# Patient Record
Sex: Male | Born: 1940 | Race: White | Hispanic: No | Marital: Married | Smoking: Former smoker
Health system: Southern US, Community
[De-identification: ages and names within clinical notes are randomized; demographics above are authoritative.]

## PROBLEM LIST (undated history)

## (undated) DIAGNOSIS — I493 Ventricular premature depolarization: Secondary | ICD-10-CM

## (undated) DIAGNOSIS — D696 Thrombocytopenia, unspecified: Secondary | ICD-10-CM

## (undated) DIAGNOSIS — S42009A Fracture of unspecified part of unspecified clavicle, initial encounter for closed fracture: Secondary | ICD-10-CM

## (undated) DIAGNOSIS — K219 Gastro-esophageal reflux disease without esophagitis: Secondary | ICD-10-CM

## (undated) DIAGNOSIS — I10 Essential (primary) hypertension: Secondary | ICD-10-CM

## (undated) DIAGNOSIS — I255 Ischemic cardiomyopathy: Secondary | ICD-10-CM

## (undated) DIAGNOSIS — I2510 Atherosclerotic heart disease of native coronary artery without angina pectoris: Secondary | ICD-10-CM

## (undated) DIAGNOSIS — Z9289 Personal history of other medical treatment: Secondary | ICD-10-CM

## (undated) DIAGNOSIS — R001 Bradycardia, unspecified: Secondary | ICD-10-CM

## (undated) DIAGNOSIS — E785 Hyperlipidemia, unspecified: Secondary | ICD-10-CM

## (undated) HISTORY — DX: Bradycardia, unspecified: R00.1

## (undated) HISTORY — DX: Ventricular premature depolarization: I49.3

## (undated) HISTORY — PX: CARDIAC CATHETERIZATION: SHX172

## (undated) HISTORY — PX: CORONARY ANGIOPLASTY: SHX604

## (undated) HISTORY — DX: Ischemic cardiomyopathy: I25.5

## (undated) HISTORY — PX: FRACTURE SURGERY: SHX138

## (undated) HISTORY — DX: Essential (primary) hypertension: I10

## (undated) HISTORY — DX: Atherosclerotic heart disease of native coronary artery without angina pectoris: I25.10

## (undated) HISTORY — DX: Hyperlipidemia, unspecified: E78.5

## (undated) HISTORY — PX: COLONOSCOPY: SHX174

## (undated) HISTORY — DX: Personal history of other medical treatment: Z92.89

---

## 1991-02-14 HISTORY — PX: CORONARY ARTERY BYPASS GRAFT: SHX141

## 2003-08-28 ENCOUNTER — Ambulatory Visit (HOSPITAL_COMMUNITY): Admission: RE | Admit: 2003-08-28 | Discharge: 2003-08-28 | Payer: Self-pay | Admitting: Gastroenterology

## 2007-10-29 ENCOUNTER — Encounter: Payer: Self-pay | Admitting: Cardiology

## 2007-10-29 HISTORY — PX: US ECHOCARDIOGRAPHY: HXRAD669

## 2008-08-13 HISTORY — PX: CORONARY STENT PLACEMENT: SHX1402

## 2008-08-19 ENCOUNTER — Encounter: Payer: Self-pay | Admitting: Cardiology

## 2008-08-25 ENCOUNTER — Inpatient Hospital Stay (HOSPITAL_BASED_OUTPATIENT_CLINIC_OR_DEPARTMENT_OTHER): Admission: RE | Admit: 2008-08-25 | Discharge: 2008-08-25 | Payer: Self-pay | Admitting: Cardiology

## 2008-08-26 ENCOUNTER — Ambulatory Visit: Payer: Self-pay | Admitting: Surgery

## 2008-09-01 ENCOUNTER — Inpatient Hospital Stay (HOSPITAL_COMMUNITY): Admission: AD | Admit: 2008-09-01 | Discharge: 2008-09-02 | Payer: Self-pay | Admitting: Cardiology

## 2008-10-08 ENCOUNTER — Ambulatory Visit (HOSPITAL_COMMUNITY): Admission: RE | Admit: 2008-10-08 | Discharge: 2008-10-08 | Payer: Self-pay | Admitting: Cardiology

## 2009-12-17 ENCOUNTER — Ambulatory Visit: Payer: Self-pay | Admitting: Cardiology

## 2010-05-22 LAB — CBC
Platelets: 113 10*3/uL — ABNORMAL LOW (ref 150–400)
WBC: 7.1 10*3/uL (ref 4.0–10.5)

## 2010-05-22 LAB — CARDIAC PANEL(CRET KIN+CKTOT+MB+TROPI)
CK, MB: 3.2 ng/mL (ref 0.3–4.0)
Total CK: 71 U/L (ref 7–232)
Troponin I: 0.14 ng/mL — ABNORMAL HIGH (ref 0.00–0.06)

## 2010-05-22 LAB — BASIC METABOLIC PANEL
BUN: 12 mg/dL (ref 6–23)
Calcium: 8.8 mg/dL (ref 8.4–10.5)
Creatinine, Ser: 0.88 mg/dL (ref 0.4–1.5)
GFR calc non Af Amer: >60 mL/min (ref >60)

## 2010-05-27 ENCOUNTER — Other Ambulatory Visit: Payer: Self-pay | Admitting: Nurse Practitioner

## 2010-05-27 DIAGNOSIS — I259 Chronic ischemic heart disease, unspecified: Secondary | ICD-10-CM

## 2010-06-08 ENCOUNTER — Other Ambulatory Visit: Payer: Self-pay | Admitting: *Deleted

## 2010-06-08 DIAGNOSIS — E7800 Pure hypercholesterolemia, unspecified: Secondary | ICD-10-CM

## 2010-06-24 ENCOUNTER — Encounter: Payer: Self-pay | Admitting: Cardiology

## 2010-06-24 ENCOUNTER — Telehealth: Payer: Self-pay | Admitting: *Deleted

## 2010-06-24 ENCOUNTER — Other Ambulatory Visit (INDEPENDENT_AMBULATORY_CARE_PROVIDER_SITE_OTHER): Payer: Medicare Other | Admitting: *Deleted

## 2010-06-24 ENCOUNTER — Ambulatory Visit (INDEPENDENT_AMBULATORY_CARE_PROVIDER_SITE_OTHER): Payer: Medicare Other | Admitting: Cardiology

## 2010-06-24 VITALS — BP 120/64 | HR 58 | Wt 177.8 lb

## 2010-06-24 DIAGNOSIS — E7800 Pure hypercholesterolemia, unspecified: Secondary | ICD-10-CM

## 2010-06-24 DIAGNOSIS — I25119 Atherosclerotic heart disease of native coronary artery with unspecified angina pectoris: Secondary | ICD-10-CM | POA: Insufficient documentation

## 2010-06-24 DIAGNOSIS — I2510 Atherosclerotic heart disease of native coronary artery without angina pectoris: Secondary | ICD-10-CM

## 2010-06-24 LAB — BASIC METABOLIC PANEL
CO2: 29 mEq/L (ref 19–32)
Chloride: 105 mEq/L (ref 96–112)
Sodium: 141 mEq/L (ref 135–145)

## 2010-06-24 LAB — LIPID PANEL
HDL: 45.4 mg/dL (ref >39.00)
LDL Cholesterol: 74 mg/dL (ref 0–99)
Total CHOL/HDL Ratio: 3
Triglycerides: 71 mg/dL (ref 0.0–149.0)

## 2010-06-24 LAB — HEPATIC FUNCTION PANEL
AST: 27 U/L (ref 0–37)
Albumin: 4.4 g/dL (ref 3.5–5.2)
Total Protein: 6.9 g/dL (ref 6.0–8.3)

## 2010-06-24 NOTE — Assessment & Plan Note (Signed)
Overall, he is doing well. His weight is 177. He has been active. 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 I'll have him see Dr. Elease Hashimoto and Lawson Fiscal in 6-8 months.

## 2010-06-24 NOTE — Telephone Encounter (Signed)
Labs reported to pt 

## 2010-06-24 NOTE — Progress Notes (Signed)
STRESS  REPORT     INDICATION FOR STUDY:   Ischemic heart   The patient exercised on the standard Bruce protocol. Exercise time:  11 min Maximum stage: 4  The test was stopped because of fatigue   Resting Heart Rate: 58 Maximal Heart Rate: 140  Resting Blood Pressure:  120/60 Maximal Blood Pressure:  160/60  The resting EKG showed normal.  The EKG with stress showed no with occasional PVC and occasional couplet.  The recovery EKG showed no ischemia or ectopy   OVERALL IMPRESSION:  1.  Excellent exercise tolerance  2. Adquateressure Response.  3.  CLINICALLY negative for chest pain  4.  EKG negative for ischemia

## 2010-06-28 NOTE — Discharge Summary (Signed)
NAME:  Brett Chambers, Brett Chambers                  ACCOUNT NO.:  [id number]   MEDICAL RECORD NO.:  [id number]          PATIENT TYPE:  INP   LOCATION:  2503                         FACILITY:  [facility]   PHYSICIAN:  Colleen Can. Deborah Chalk, M.D.DATE OF BIRTH:  [birth year]   DATE OF ADMISSION:  09/01/2008  DATE OF DISCHARGE:  09/02/2008                               DISCHARGE SUMMARY   DISCHARGE DIAGNOSES:  1. Previous abnormal stress Cardiolite study with subsequent elective      cardiac catheterization now with successful stenting of the      bifurcation lesion involving the left circumflex and left      circumflex marginal vessel.  2. Known coronary artery disease with remote bypass surgery in 1993      with a right internal mammary to the left anterior descending, vein      graft to the first and second diagonal, vein graft to the first      obtuse marginal and second obtuse marginal, and a vein graft to the      posterior descending.  3. Mild hypertension.  4. Probable white coat syndrome.  5. Hyperlipidemia, currently maintained on Zocor.   HISTORY OF PRESENT ILLNESS:  Brett Chambers is a very pleasant 70 year old white  male, who has known ischemic heart disease.  He has recently undergone  cardiac catheterization following an abnormal stress test earlier this  month.  He subsequently was brought back for attempted angioplasty to  the native left circumflex.  Clinically, he has done well without  complaints of chest pain or shortness of breath.   Please see the history and physical for further patient presentation and  profile.   LABORATORY DATA ON ADMISSION:  His PT and PTT were unremarkable.  CBC  was completely normal.  His chemistries were normal except for a  potassium of 5.4, BUN was 14, and creatinine was 1.9.   HOSPITAL COURSE:  The patient was admitted electively on September 01, 2008.  He underwent percutaneous coronary intervention to the left circumflex  and circumflex marginal.  He subsequently had  2 stents placed, these  were both XIENCE.  He currently has a 2.75 x 12 mm and a 2.75 x 28 mm in  place and result was very satisfactory.  He was subsequently transferred  to 2500 and today on September 02, 2008, he is doing well without complaints.  He has been up and ambulatory with cardiac rehab.  His overall physical  exam is unremarkable.  His postprocedure lab is satisfactory.  He did  have a mild bump in his troponin to 0.14.  His CK-MB is negative, but  overall he is felt to be a satisfactory candidate for discharge today.   DISCHARGE CONDITION:  Stable.   DISCHARGE DIET:  Low-salt heart-healthy.   WOUND CARE:  He is to use an ice pack if needed to the groin.   ACTIVITY:  To be increased as tolerated.   DISCHARGE MEDICINES:  1. Aspirin 325 mg a day.  2. Plavix 75 mg a day.  3. Zocor 80 mg a day.  4. Lisinopril 5 mg  a day.   We will plan on seeing him back in the office in approximately 7-10  days, certainly sooner if any problems that arise in the interim.   Greater than 30 minutes was spent for discharge.      Sharlee Blew, N.P.      Colleen Can. Deborah Chalk, M.D.  Electronically Signed    LC/MEDQ  D:  09/02/2008  T:  09/02/2008  Job:  [id number]

## 2010-06-28 NOTE — Cardiovascular Report (Signed)
NAME:  Brett Chambers, Brett Chambers                  ACCOUNT NO.:  [id number]   MEDICAL RECORD NO.:  [id number]          PATIENT TYPE:  INP   LOCATION:  2503                         FACILITY:  [facility]   PHYSICIAN:  Colleen Can. Deborah Chalk, M.D.DATE OF BIRTH:  06-Oct-1940   DATE OF PROCEDURE:  09/01/2008  DATE OF DISCHARGE:                            CARDIAC CATHETERIZATION   PROCEDURE:  Bifurcation stent involvement of the left circumflex and  left circumflex marginal vessel.   TYPE AND SITE OF ENTRY:  Percutaneous right femoral artery with Angio-  Seal.   CATHETERS:  A 6-French JL-4 Voda guide, 2 Prowater guidewires, initially  a 2.0 x 20 mm apex balloon for predilatation and then a 3.0 x 10 mm  cutting balloon in the left circumflex portion of the stenosis, then  returning with a 2.5 x 20 mm apex balloon for predilatation of the  obtuse marginal.   At that point in time, we elected to place a 2.75 x 12 mm Xience stent  in the left circumflex itself with approximately 2-3 mm area of skip  segment between the proximal portion of the stent and the origin of the  obtuse marginal.  This was done to ensure the possibility of getting the  longer stent to enter the ostium of the obtuse marginal and to advance  distally.  The stent in the continuation of the circumflex was inflated  to maximum of 14 atmospheres.  We then returned with a 2.75 x 28 mm  Xience stent.  It is placed in the left circumflex marginal and extended  back into the more proximal portion of the circumflex.  It was inflated  to a maximum of 17 atmospheres.   Final angiographic result was felt to be very good, but there were some  distal spasm in the left circumflex.  Intracoronary nitroglycerin, 100  mcg was given with resolution of the spasm and an excellent angiographic  result.   OVERALL IMPRESSION:  Successful stent placements in the bifurcation  lesion involving the left circumflex and left circumflex marginal.   Angio-Seal was  completed with Ancef given.  The patient tolerated the  procedure well.  At the end of the procedure, we were able to see  collateral flow in a retrograde manner to the diagonal vessels on the  anterior lateral portion of heart.      Colleen Can. Deborah Chalk, M.D.  Electronically Signed     SNT/MEDQ  D:  09/01/2008  T:  09/02/2008  Job:  [id number]

## 2010-06-28 NOTE — H&P (Signed)
NAME:  Brett Chambers, Brett Chambers                  ACCOUNT NO.:  [id number]   MEDICAL RECORD NO.:  [id number]          PATIENT TYPE:  JCAR   LOCATION:  CARD                         FACILITY:  [facility]   PHYSICIAN:  Colleen Can. Deborah Chalk, M.D.DATE OF BIRTH:  [birth year]   DATE OF ADMISSION:  08/25/2008  DATE OF DISCHARGE:                              HISTORY & PHYSICAL   CHIEF COMPLAINT:  None.   HISTORY OF PRESENT ILLNESS:  Brett Chambers is a 70 year old white male who  has known ischemic heart disease.  He had remote bypass surgery in 1993.  He has most recently undergone a treadmill testing for his [employer] license.  He had mild ST-segment depression.  He was subsequently referred for  stress thallium.  This was performed on August 19, 2008 which showed  anterolateral ischemia with a normal global ejection fraction.  He is  now referred for cardiac catheterization.  Clinically, he has been  asymptomatic.   PAST MEDICAL HISTORY:  1. Coronary artery bypass grafting in 1993 with right internal mammary      artery to the LAD, saphenous vein graft to the first and second      diagonal sequentially, saphenous vein graft to the first obtuse      marginal and second obtuse marginal and a saphenous vein graft to      the posterior descending coronary artery.  2. Hyperlipidemia.  3. Mild hypertension.  4. Probable white coat syndrome.   ALLERGIES:  None.   CURRENT MEDICATIONS:  Aspirin daily, Zocor 80 mg a day and lisinopril 5  mg a day.   FAMILY HISTORY:  Positive for coronary artery disease.   SOCIAL HISTORY:  He is married.  One daughter is a Engineer, civil (consulting) in the  operating room.  He has no current alcohol or tobacco use.   REVIEW OF SYSTEMS:  He has had no fever, flu or cough.  He has had no  chest pain or shortness of breath.  He is not lightheaded or dizzy.  He  continues to be very active.  All other review of systems are negative.   PHYSICAL EXAMINATION:  GENERAL:  He is a pleasant white male who appears  younger  than his stated age.  SKIN:  Warm and dry.  Color is unremarkable.  VITAL SIGNS:  Blood pressure 140/80 sitting and 136/78 standing.  Heart  rate 66 and regular.  Weight 193 pounds.  HEENT:  Normocephalic, atraumatic.  Pupils are equal and reactive.  Sclera is nonicteric.  NECK:  Supple.  No adenopathy.  No JVD.  LUNGS:  Clear.  HEART:  A regular rhythm.  ABDOMEN: Soft, positive bowel sounds and nontender.  EXTREMITIES: Without edema.  MUSCULOSKELETAL:  Strength to be symmetric.  Gait and range of motion  are intact.  NEUROLOGIC:  No gross focal deficits.   PERTINENT LABORATORY DATA:  Pending.   OVERALL IMPRESSION:  1. Abnormal stress thallium study.  2. Remote coronary artery bypass grafting.  3. Hyperlipidemia.   PLAN:  We will proceed on with diagnostic cardiac catheterization.  The  risk of procedure and benefits have  all been explained and he is willing  to proceed on Tuesday, August 25, 2008.      Sharlee Blew, N.P.      Colleen Can. Deborah Chalk, M.D.  Electronically Signed    LC/MEDQ  D:  08/21/2008  T:  08/22/2008  Job:  [id number]

## 2010-06-28 NOTE — H&P (Signed)
NAME:  Brett Chambers, Brett Chambers                  ACCOUNT NO.:  [id number]   MEDICAL RECORD NO.:  [id number]          PATIENT TYPE:  OIB   LOCATION:                               FACILITY:  [facility]   PHYSICIAN:  Colleen Can. Deborah Chalk, M.D.DATE OF BIRTH:  1940/03/14   DATE OF ADMISSION:  10/08/2008  DATE OF DISCHARGE:                              HISTORY & PHYSICAL   CHIEF COMPLAINT:  Chest pain.   HISTORY OF PRESENT ILLNESS:  [name] is a 70 year old white male who  had stenting of a bifurcation lesion involving the left circumflex and  left circumflex marginal approximately 4 weeks ago.  He now presents for  repeat cardiac catheterization.  He has had a multitude of somatic  complaints over the course of the past 4 weeks.  He has had a soreness  in his chest that initially started when he took a deep breath, but now  it continues to be persistent.  It is worse if he palpates and touches  his chest, but it is also worse with no palpation.  He is short of  breath with going up steps.  He feels more fatigued and has no energy.  He has also had an episode where he got overheated and got extremely  dizzy.  Overall, he has just not done well over the course of the past 4  weeks and he now referred for repeat cardiac catheterization to discern  patency of the vessel.   PAST MEDICAL HISTORY:  Known ischemic heart disease with remote bypass  surgery in 1993 with a right internal mammary to the LAD, vein graft to  the first and second diagonal, vein graft to the first obtuse marginal  and second obtuse marginal vessel and a patent graft to the posterior  descending.  His recent cardiac catheterization led to stenting of the  left circumflex and left circumflex marginal bifurcation.  Other  problems include mild hypertension, probable white coat syndrome, and  hyperlipidemia.   ALLERGIES:  None.   CURRENT MEDICINES:  1. Plavix 75 mg a day.  2. Lisinopril 5 mg a day.  3. Zocor 80 mg a day.  4. Aspirin  daily.   FAMILY HISTORY:  Positive for coronary artery disease.   SOCIAL HISTORY:  He is married.  He lives with his wife.  He has one  daughter who is a Engineer, civil (consulting) at the hospital.  He has no current alcohol or  tobacco use.  He stopped smoking in 1965.   REVIEW OF SYSTEMS:  He has had no recent fever, flu, or cough.  His  chest pain is as described above.  He has had no abdominal pain,  constipation, or diarrhea.  He seems to be tolerating his medicines  well.  All other review of systems are negative.   PHYSICAL EXAMINATION:  GENERAL:  He is pleasant.  He is in no acute  distress.  VITAL SIGNS:  Blood pressure 130/72 sitting and 112/70 standing.  Heart  rate 60.  Weight is 184 pounds.  SKIN:  Warm and dry.  Color is unremarkable.  HEENT:  Normocephalic, atraumatic.  Pupils are equal and reactive to  light.  Sclerae is nonicteric.  NECK:  Supple without masses or JVD.  LUNGS:  Clear.  CARDIAC:  Regular rhythm.  ABDOMEN:  Soft, positive bowel sounds, nontender.  EXTREMITIES:  Without edema.  MUSCULOSKELETAL:  Gait and range of motion are intact.  Strength is  symmetric.  NEUROLOGIC:  No gross focal deficits.   PERTINENT LABS:  His EKG shows sinus rhythm with no acute changes.   OVERALL IMPRESSION:  1. Multitude of somatic complaints including chest pain and shortness      of breath with associated fatigue.  2. Recent stenting of a bifurcating left circumflex marginal vessel      lesion.  3. Remote bypass surgery.  4. Hypertension.  5. Hyperlipidemia.   PLAN:  We have made arrangements for him to undergo repeat cardiac  catheterization.  His medicines will be continued.  We will proceed on  with this procedure on Thursday, October 08, 2008.  He is to call us if  any problems arise in the interim.      Sharlee Blew, N.P.      Colleen Can. Deborah Chalk, M.D.  Electronically Signed    LC/MEDQ  D:  10/06/2008  T:  10/07/2008  Job:  [id number]

## 2010-06-28 NOTE — Cardiovascular Report (Signed)
NAME:  Brett Chambers, Brett Chambers                  ACCOUNT NO.:  [id number]   MEDICAL RECORD NO.:  [id number]          PATIENT TYPE:  OIB   LOCATION:  2899                         FACILITY:  [facility]   PHYSICIAN:  Colleen Can. Deborah Chalk, M.D.DATE OF BIRTH:  11/11/1940   DATE OF PROCEDURE:  10/08/2008  DATE OF DISCHARGE:  10/08/2008                            CARDIAC CATHETERIZATION   PROCEDURE:  Left heart catheterization with selective coronary  angiography, left ventricular angiography, angiography of right internal  mammary artery to the left anterior descending, and saphenous vein graft  to the right coronary artery.   TYPE AND SITE OF ENTRY:  Percutaneous right femoral artery with Angio-  Seal.   CATHETERS:  A 6-French full curved Judkins right-left coronary catheter  and 6-French pigtail ventriculographic catheter.   CONTRAST MATERIAL:  Omnipaque.   MEDICATIONS PRIOR TO PROCEDURE:  Valium 10 mg.   MEDICATIONS GIVEN DURING THE PROCEDURE:  Versed 2 mg IV.   COMMENTS:  The patient tolerated the procedure well.   HEMODYNAMIC DATA:  The aortic pressure was 118/66, LV was 100/4-10.  There was no aortic valve gradient and no pullback.   ANGIOGRAPHIC DATA:  Left ventricular angiogram was performed in the RAO  position.  The overall cardiac size and silhouette were normal.  Global  ejection fraction was estimated to be 60%.   The right internal mammary artery graft to the LAD was widely patent  with a nice insertion.  There were retrograde collaterals to the  diagonal vessel.  There was retrograde fill of the left anterior  descending to the point of the first septal perforating branch.  There  was stenosis at the level of the first septal perforating branch, that  is a potential area for ischemia, although relatively minimal.   Saphenous vein graft to posterior descending is widely patent.  There is  a 40-50% narrowing proximally and 40-50% narrowing in the distal  portions of the body of the  saphenous vein graft.  The insertion site is  satisfactory into the posterior descending with retrograde fill of the  right coronary artery extending into the posterolateral branches.   Saphenous vein graft to the diagonal 1/diagonal 2 is occluded.   Saphenous vein graft to the obtuse marginal 1/obtuse marginal 2 is  occluded.   Right coronary artery was not imaged but it was occluded proximally by  previous films.   Left anterior descending is totally occluded proximally.   Left main coronary artery is normal.   The left circumflex is patent with stents extending into the obtuse  marginal and into the left circumflex continuation branch being widely  patent with adequate fill without significant stenosis.  There is good  distal fill of the vessels.  The stents appeared to be satisfactory in  their course.  The stent in a continuation branch end approximately a  millimeter before the stent in the marginal vessel and proximal  circumflex.  Overall, it is felt to be a satisfactory conduit with no  significant obstructive stenosis.   OVERALL IMPRESSION:  1. 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  2. Persistent patency of the stents in the left circumflex system      involving the proximal obtuse marginal and left circumflex      continuation branches in a bifurcation stent arrangement.  3. Normal left ventricular function.   DISCUSSION:  In light of these findings, it is felt that Mr. Shein is at  low risk and will need to be managed medically.  It is felt that his  chest discomfort is not anginal in nature.      Colleen Can. Deborah Chalk, M.D.  Electronically Signed     SNT/MEDQ  D:  10/08/2008  T:  10/09/2008  Job:  [id number]

## 2010-06-28 NOTE — H&P (Signed)
NAME:  Brett Chambers, Brett Chambers                  ACCOUNT NO.:  [id number]   MEDICAL RECORD NO.:  [id number]          PATIENT TYPE:  OIB   LOCATION:  1967                         FACILITY:  [facility]   PHYSICIAN:  Colleen Can. Deborah Chalk, M.D.DATE OF BIRTH:  December 13, 1940   DATE OF ADMISSION:  08/25/2008  DATE OF DISCHARGE:  08/25/2008                              HISTORY & PHYSICAL   CHIEF COMPLAINT:  None.   HISTORY OF PRESENT ILLNESS:  Brett Chambers is a pleasant 70 year old white  male who has known ischemic heart disease.  He has most recently  undergone cardiac catheterization on August 25, 2008.  He now presents for  attempts at angioplasty of the native left circumflex.  Clinically, he  has done well without complaints of chest pain or shortness of breath.  He did have remote bypass surgery in 1993 and has most recently had an  abnormal Cardiolite study.   PAST MEDICAL HISTORY:  1. Coronary artery disease with previous bypass surgery in 1993 with a      right internal mammary to the LAD, saphenous vein graft to the      first and second diagonal sequentially, saphenous vein graft to the      first OM and second OM, and a saphenous vein graft to the posterior      descending.  His last cardiac catheterization occurred on August 25, 2008 which showed severe three-vessel disease with a totally      occluded right coronary artery, totally occluded LAD, and a 95%      left circumflex, patent right internal mammary graft to the LAD,      patent but moderate disease in the saphenous vein graft to the      right coronary with excellent flow, totally occluded saphenous vein      graft to provide sequential flow to the first and second diagonal      and sequential flow to the first and second obtuse marginal.  He      did have normal LV function.  2. Hypertension, mild.  3. Probable white coat syndrome.  4. Hyperlipidemia.   ALLERGIES:  None.   CURRENT MEDICATIONS:  1. Aspirin daily.  2. Zocor 80 mg a  day.  3. Lisinopril 5 mg a day.  4. He has now just been started on Plavix 75 mg a day.   FAMILY HISTORY:  Positive for coronary artery disease.   SOCIAL HISTORY:  He is married.  He lives with his wife.  He has one  daughter who is a Engineer, civil (consulting) in the operating room at [employer].  He has no  current alcohol or tobacco use.   REVIEW OF SYSTEMS:  He had no recent fever, flu, or cough.  He has had  no chest pain, shortness of breath, lightheadedness, or dizziness.  He  remains very active.  All other review of systems are negative.   PHYSICAL EXAMINATION:  GENERAL:  He is a pleasant white male who looks  younger than his stated age.  He is currently in no acute distress.  VITAL SIGNS:  Weight is 190 pounds.  Blood pressure 132/78 sitting and  112/78 standing.  Heart rate is 56 and regular.  Respirations 18.  SKIN:  Warm and dry.  Color is unremarkable.  HEENT:  Normocephalic and atraumatic.  Pupils are equal and reactive.  Sclerae is nonicteric.  NECK:  Supple with no masses.  No JVD.  LUNGS:  Clear.  HEART:  Regular rhythm.  ABDOMEN:  Soft, positive bowel sounds, and nontender.  EXTREMITIES:  Without edema.  MUSCULOSKELETAL:  Strength to be symmetric.  Gait and range of motion  are intact.  NEUROLOGIC:  No gross focal deficits.   PERTINENT LABORATORY DATA:  Pending.   OVERALL IMPRESSION:  1. Coronary artery disease.  2. Remote bypass surgery.  3. Recent cardiac catheterization with results as noted above.  4. Hyperlipidemia.  5. Mild hypertension.   PLAN:  We will proceed on with attempts at percutaneous coronary  intervention to the native left circumflex.  The procedure has been  reviewed in full detail and he is willing to proceed.  The patient was  started on Plavix 75 mg a day.      Sharlee Blew, N.P.      Colleen Can. Deborah Chalk, M.D.  Electronically Signed    LC/MEDQ  D:  08/27/2008  T:  08/27/2008  Job:  [id number]

## 2010-06-28 NOTE — Cardiovascular Report (Signed)
NAME:  Brett Chambers, Brett Chambers                  ACCOUNT NO.:  [id number]   MEDICAL RECORD NO.:  [id number]          PATIENT TYPE:  OIB   LOCATION:  1967                         FACILITY:  [facility]   PHYSICIAN:  Colleen Can. Deborah Chalk, M.D.DATE OF BIRTH:  [birth year]   DATE OF PROCEDURE:  08/25/2008  DATE OF DISCHARGE:  08/25/2008                            CARDIAC CATHETERIZATION   PROCEDURES:  Left heart catheterization with selective coronary  angiography, left ventricular angiography, saphenous vein graft  angiography x3, and angiography of right internal mammary artery.   TYPE AND SITE OF ENTRY:  Percutaneous right femoral artery.   CATHETERS:  4-French 4 curved Judkins right and left coronary catheters,  4-French pigtail ventriculography catheter.   CONTRAST MATERIAL:  Omnipaque.   MEDICATIONS GIVEN PRIOR TO PROCEDURE:  Valium 10 mg.   MEDICATIONS GIVEN DURING THE PROCEDURE:  Versed 3 mg IV.   COMMENTS:  The patient tolerated the procedure well.   HEMODYNAMIC DATA:  The LV pressure is 118/9-22.  The aortic pressure is  112/60.  There is no aortic valve gradient.  There was on pullback.   ANGIOGRAPHIC DATA:  1. Left main coronary artery and very minimal distal narrowing.  2. Left anterior descending was totally occluded proximal with some      very scanty antegrade flow.  3. Left circumflex:  Left circumflex had 60-70% narrowing at the early      point near its bifurcation.  The marginal branch at this point had      a 95% stenosis in the segmental length.  It was 2.5-mm vessel in      both sections.   Right coronary artery is totally occluded proximal with some TIMI grade  2 flow into the mid portion of the vessel before it is totally occluded.   Saphenous vein graft to the obtuse marginal vessel is totally occluded  proximally.   Saphenous vein graft to the diagonal vessel is totally occluded  proximally.   Saphenous vein graft to the right coronary artery is widely patent.  It  has  a 40-50% narrowing proximally and 40-50% narrowing in the distal  portion of the body of the graft.  The insertion site is excellent with  good distal runoff.   The right internal mammary artery graft to the LAD is widely patent.  It  is smooth in contour.  It has a nice insertion into the left anterior  descending and gives some retrograde fill to the first diagonal vessel.  Distal runoff is satisfactory.  There is some collateral to distal  lateral branch.   Left ventricular angiogram was performed in the RAO position.  Overall,  cardiac size and silhouette are normal.  The global ejection fraction is  estimated to be 60%.  Regional wall motion is normal.   OVERALL IMPRESSION:  1. Severe three-vessel coronary artery disease with totally occluded      right coronary artery, totally occluded left anterior descending,      and 95% left circumflex.  2. Patent right internal mammary artery graft to the left anterior      descending.  3. Patent, but mild-to-moderate disease in the saphenous vein graft to      the right coronary artery with excellent flow.  4. Totally occluded saphenous vein graft that provides sequential flow      to the first and second diagonal and sequential flow to the first      and second obtuse marginal.  5. Normal left ventricular function.   DISCUSSION:  Angioplasty of the left circumflex native vessel would be  complex at the bifurcation point as well as the size of the vessel.  We  will discuss that option as well as seeking opinions for possibility of  redo coronary artery bypass grafting with the right internal mammary  artery crossing the sternum and supplying excellent flow to the LAD.      Colleen Can. Deborah Chalk, M.D.  Electronically Signed     SNT/MEDQ  D:  08/25/2008  T:  08/26/2008  Job:  [id number]

## 2010-06-28 NOTE — Consult Note (Signed)
NEW PATIENT CONSULTATION   Brett Chambers, Brett Chambers  DOB:  [birth year]                                        August 26, 2008  CHART #:  [id number]   REFERRING PHYSICIAN:  Colleen Can. Deborah Chalk, MD   REASON FOR CONSULTATION:  Severe native three-vessel coronary artery  disease and saphenous vein graft disease status post coronary bypass  surgery in 1993.   CLINICAL HISTORY:  I was asked by Dr. Deborah Chalk to evaluate the patient  for consideration of redo coronary artery bypass graft surgery.  He is a  70 year old gentleman who underwent coronary bypass surgery in 1993 by  Dr. Edwyna Shell.  He had a right internal mammary artery graft placed over to  the LAD because the left internal mammary graft was felt to be small  with inadequate floor.  He also had a graft to a diagonal as well as a  graft to an obtuse marginal and a graft to the posterior descending  coronary artery.  He has been done well over the years.  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  Recent treadmill test showed mild ST-segment depression.  He  underwent a stress thallium exam on August 19, 2008, that showed  anterolateral ischemia with normal global ejection fraction.  He  subsequently underwent cardiac catheterization on August 25, 2008.  The  left main was patent with minimal narrowing.  The LAD was totally  occluded proximally with scant antegrade flow to a tiny diagonal branch.  The left circumflex had 60-70% narrowing.  There was a marginal branch  that came off at this area of narrowing and had about 95% stenosis  proximally.  This was felt to be a 2.5-mm vessel.  The distal left  circumflex terminated as a small posterolateral branch.  The right  coronary artery was totally occluded proximally with TIMI grade II flow  into the midportion of the vessel before it was totally occluded.  The  saphenous vein graft to the diagonal and obtuse marginal were totally  occluded.   Saphenous vein graft to the right coronary artery was widely  patent with about 40-50% proximal narrowing and 40-50% distal narrowing  in the body of the graft.  There is a large posterior descending and  posterolateral branch that had no significant disease in them.  The  right internal mammary graft to the LAD was widely patent.  The LAD had  no significant disease in it and gave some retrograde flow to a tiny  first diagonal branch.  Left ventriculogram showed ejection fraction of  60% with normal wall motion.   REVIEW OF SYSTEMS:  GENERAL:  He denies any fever or chills.  He denies  any recent weight changes.  He denies fatigue.  EYES:  Negative.  ENT:  Negative.  ENDOCRINE:  He denies diabetes and hypothyroidism.  CARDIOVASCULAR:  He denies any chest pain or pressure, but said that  since he had his abnormal stress test he has noted an occasional  abnormal feeling in his left anterior chest associated with exertion.  He is not sure whether this was present prior to that or whether he is  just more in tune and worrying about what is going on.  He has had no  exertional dyspnea.  He denies PND or orthopnea.  He has had no  peripheral edema or palpitations.  RESPIRATORY:  He denies cough and sputum production.  GI:  He has had no nausea or vomiting.  He denies melena and bright red  blood per rectum.  GU:  He denies dysuria and hematuria.  MUSCULOSKELETAL:  He denies arthralgias and myalgias.  NEUROLOGIC:  He denies any focal weakness or numbness.  He denies  dizziness and syncope.  He has never had TIA or stroke.   ALLERGIES:  None.   MEDICATIONS:  1. Lisinopril 5 mg daily.  2. Zocor 80 mg daily.  3. Aspirin 81 mg daily.   PAST MEDICAL HISTORY:  Significant for hypertension, hyperlipidemia.  He  has history of coronary disease as mentioned above.   FAMILY HISTORY:  Positive for cardiac disease.   SOCIAL HISTORY:  He is married and here with his wife and daughter who  is a  Engineer, civil (consulting) in the operating room at [employer].  He denies smoking and  alcohol use.   PHYSICAL EXAMINATION:  His blood pressure is 145/84 and his pulse 61 and  regular.  Respiratory rate is 18 and unlabored.  Oxygen saturation on  room air is 95%.  He looks well.  HEENT exam shows him to be  normocephalic and atraumatic.  Pupils are equal and reactive to light  and accommodation.  Extraocular muscles are intact.  His throat is  clear.  Neck exam shows normal carotid pulses bilaterally.  There are no  bruits.  There is no adenopathy or thyromegaly.  Cardiac exam shows  regular rate and rhythm with normal S1 and S2.  There is no murmur, rub,  or gallop.  His lungs are clear.  Abdominal exam shows active bowel  sounds.  His abdomen is soft and nontender.  There are no palpable  masses or organomegaly.  Extremity exam shows no peripheral edema.   IMPRESSION:  The patient has severe native three-vessel coronary artery  disease with occluded saphenous vein grafts to his diagonal and obtuse  marginal.  He has a widely patent right internal mammary graft that  crosses the midline and supplies his left anterior descending.  He also  has a patent mildly diseased saphenous vein graft to the right coronary  system.  His significant disease at this time is in his left circumflex  territory with a 95% proximal obtuse marginal stenosis.  This is a  significant sized vessel that has a large territory and could be a  source of significant ischemia.  I think this vessel is graftable, but  could probably also be treated percutaneously with stenting.  His  diagonal branches are tiny vessels and certainly are not suitable for  bypass.  His stress test did show anterolateral ischemia which I guess  could be related to these tiny diagonal branches, although they do not  appear large enough to carry significant blood flow.  With a patent  right internal mammary graft crossing the midline, I would not recommend   doing redo coronary bypass surgery unless absolutely necessary.  This  right internal mammary graft may be adherent to the under surface of the  sternum and could be injured with opening the sternum which would be  potentially catastrophic.  It also may be stuck down to his ascending  aorta making opening the sternum difficult.  I think the only vessel  that would be suitable for grafting at redo surgery would be his obtuse  marginal and this could be treated with stenting.  I explained to him  that he may still have an abnormality on his stress test even with  stenting of his obtuse marginal branch if indeed the diagonal territory  is responsible for his abnormality on the thallium stress test.  In  spite of this, I think he would probably be best served by stenting of  the obtuse marginal branch because if this occludes he would have  significant lateral infarct.  I have explained all this to him and his  wife and daughter and reviewed the cardiac catheterization films with  them.  They seemed to understand all this.  They are in agreement and  will follow up tomorrow with Dr. Deborah Chalk to discuss percutaneous  intervention with him.   Evelene Croon, M.D.  Electronically Signed   BB/MEDQ  D:  08/26/2008  T:  08/27/2008  Job:  [id number]   cc:   Colleen Can. Deborah Chalk, M.D.

## 2010-06-30 ENCOUNTER — Ambulatory Visit: Payer: Self-pay | Admitting: Cardiology

## 2010-07-01 NOTE — Op Note (Signed)
NAME:  Brett Chambers, Brett Chambers                            ACCOUNT NO.:  [id number]   MEDICAL RECORD NO.:  [id number]                   PATIENT TYPE:  AMB   LOCATION:  ENDO                                 FACILITY:  [facility]   PHYSICIAN:  Bernette Redbird, M.D.                DATE OF BIRTH:  06-Dec-1940   DATE OF PROCEDURE:  08/28/2003  DATE OF DISCHARGE:                                 OPERATIVE REPORT   PROCEDURE:  Colonoscopy.   INDICATIONS:  A 70 year old with recent small-volume hematochezia and mucoid  stool, now resolved.   FINDINGS:  Normal exam to the cecum.   PROCEDURE:  The nature, purpose, and risks of the procedure had been  discussed with the patient, who provided written consent.  Sedation was  fentanyl 75 mcg and Versed 6 mg IV without arrhythmias or desaturation.  Digital exam of the prostate was unremarkable.  The Olympus adjustable-  tension adult video colonoscope was advanced to the cecum without difficulty  and pullback was then performed.  The cecum was identified by the absence of  further lumen and typical cecal appearance.  Pullback was then performed.  The quality of the prep was excellent, so it is felt that all areas were  well-seen.   This was a normal examination.  No polyps, cancer, colitis, vascular  malformations, or diverticulosis were noted.  Retroflexion in the rectum and  reinspection of the rectum were unremarkable.  There was perhaps just a tiny  bit of contact hemorrhage of friability in the rectum and a little bit of  erythematous injection of the rectum, but without frank loss of vascularity  nor any other significant findings to suggest inflammation such as  granularity or exudate.  No biopsies were obtained.  The patient tolerated  the procedure well, and there were no apparent complications.  Note that  there were moderate internal hemorrhoids seen during pull-out through the  anal canal.   IMPRESSION:  Essentially normal colonoscopy to the cecum.   PLAN:  Consider sigmoidoscopic screening in about five years for ongoing  colon cancer screening.                                               Bernette Redbird, M.D.    RB/MEDQ  D:  08/28/2003  T:  08/28/2003  Job:  [id number]   cc:   Colleen Can. Deborah Chalk, M.D.  Fax: [phone]

## 2010-09-26 ENCOUNTER — Other Ambulatory Visit: Payer: Self-pay | Admitting: Cardiology

## 2010-09-26 ENCOUNTER — Other Ambulatory Visit: Payer: Self-pay | Admitting: Cardiovascular Disease

## 2010-09-26 MED ORDER — CLOPIDOGREL BISULFATE 75 MG PO TABS: 75.0000 mg | ORAL_TABLET | Freq: Every day | ORAL | Status: DC

## 2010-09-26 NOTE — Telephone Encounter (Signed)
Called because her husband needs a refill of his Plavix filled at [facility] on [address] [phone]. Please call back. I have pulled his chart.

## 2010-09-26 NOTE — Telephone Encounter (Signed)
Wanted plavix refilled. Sent in.

## 2010-12-20 ENCOUNTER — Other Ambulatory Visit: Payer: Medicare Other | Admitting: *Deleted

## 2010-12-20 ENCOUNTER — Encounter: Payer: Self-pay | Admitting: *Deleted

## 2010-12-22 ENCOUNTER — Encounter: Payer: Self-pay | Admitting: Cardiovascular Disease

## 2010-12-22 ENCOUNTER — Ambulatory Visit (INDEPENDENT_AMBULATORY_CARE_PROVIDER_SITE_OTHER): Payer: Medicare Other | Admitting: Cardiovascular Disease

## 2010-12-22 ENCOUNTER — Other Ambulatory Visit: Payer: Medicare Other | Admitting: *Deleted

## 2010-12-22 DIAGNOSIS — I10 Essential (primary) hypertension: Secondary | ICD-10-CM | POA: Insufficient documentation

## 2010-12-22 DIAGNOSIS — E785 Hyperlipidemia, unspecified: Secondary | ICD-10-CM | POA: Insufficient documentation

## 2010-12-22 DIAGNOSIS — I2510 Atherosclerotic heart disease of native coronary artery without angina pectoris: Secondary | ICD-10-CM

## 2010-12-22 MED ORDER — ATORVASTATIN CALCIUM 20 MG PO TABS: 20.0000 mg | ORAL_TABLET | Freq: Every day | ORAL | Status: DC

## 2010-12-22 NOTE — Progress Notes (Signed)
Carmelina Peal Date of Birth  [birth year] [facility] [address]    [address] [place], [place]  [address] [phone]  Fax  [phone]  History of Present Illness:  Mr. Windt is a 70 year old gentleman with history of coronary artery disease and coronary artery bypass grafting. He also has had PTCA and stenting. He also has a diagnosis of hypertension and hyperlipidemia.  Mr. Pennypacker has been very active. He denies any episodes of chest pain or shortness of breath. He comments that his blood pressure is typically elevated when he comes to the doctor's office.   Current Outpatient Prescriptions on File Prior to Visit  Medication Sig Dispense Refill  . aspirin 325 MG tablet Take 325 mg by mouth daily.        . clopidogrel (PLAVIX) 75 MG tablet Take 1 tablet (75 mg total) by mouth daily.  30 tablet  6  . lisinopril (PRINIVIL,ZESTRIL) 5 MG tablet Take 5 mg by mouth daily.        . simvastatin (ZOCOR) 80 MG tablet TAKE ONE TABLET BY MOUTH EVERY DAY  30 tablet  6    No Known Allergies  Past Medical History  Diagnosis Date  . Coronary artery disease     He status post coronary artery bypass grafting.  Marland Kitchen Hyperlipidemia   . Hypertension     mild hypertension  . White coat hypertension     Past Surgical History  Procedure Date  . Coronary artery bypass graft 1993    RIMA-LAD, SVG-1ST, 2ND DIAG, SVG-1ST, 2 ND OM, PD  . Coronary angioplasty   . Coronary stent placement 7/10    Stent- Left Cirx  . Cardiac catheterization     His most recent in August of 2010 showed totally occluded LAD,  . US echocardiography 10-29-2007    EF 55-60%    History  Smoking status  . Former Smoker  . Quit date: 02/14/1963  Smokeless tobacco  . Not on file    History  Alcohol Use: Not on file    No family history on file.  Reviw of Systems:  Reviewed in the HPI.  All other systems are negative.  Physical Exam: BP 167/88  Pulse 69  Ht 6' (1.829 m)  Wt 182 lb 6.4 oz (82.736 kg)   BMI 24.74 kg/m2 Is recheck blood pressure was 110/70 The patient is alert and oriented x 3.  The mood and affect are normal.   Skin: warm and dry.  Color is normal.    HEENT:   Normocephalic/atraumatic. He has normal carotids. No JVD.  Lungs: His lungs are clear.   Heart: Regular rate, S1-S2. He has no murmurs.    Abdomen: Good bowel sounds. There is no hepatosplenomegaly.  Extremities:  No clubbing cyanosis or edema.  Neuro:  Nonfocal, his gait is normal.     Assessment / Plan:

## 2010-12-22 NOTE — Patient Instructions (Addendum)
Your physician wants you to follow-up in: 6 months You will receive a reminder letter in the mail two months in advance. If you don't receive a letter, please call our office to schedule the follow-up appointment.  Your physician recommends that you return for a FASTING lipid profile: 3 months and in 6 months Your physician has recommended you make the following change in your medication:   1) STOP simvastatin  2) START Atorvastatin 20 mg daily  NO LABS TODAY SINCE MEDS BEING SWITCHED AND LABS/ LIVER NORMAL IN MAY 2012

## 2010-12-22 NOTE — Assessment & Plan Note (Signed)
He is stable .  No angina.  Continue current meds  

## 2010-12-22 NOTE — Assessment & Plan Note (Signed)
[name] on a dose of simvastatin it is too high. We'll discontinue the simvastatin start him on atorvastatin 20 mg a day.  [name] had a question of a rash with atorvastatin the past.  If This happens again we'll start him back on simvastatin but at a lower dose-40 mg a day.  We'll check fasting labs in 3 months and again in 6 months when I see him for an office visit.

## 2011-01-10 ENCOUNTER — Encounter: Payer: Self-pay | Admitting: Internal Medicine

## 2011-03-24 ENCOUNTER — Other Ambulatory Visit (INDEPENDENT_AMBULATORY_CARE_PROVIDER_SITE_OTHER): Payer: Medicare Other | Admitting: *Deleted

## 2011-03-24 DIAGNOSIS — I2510 Atherosclerotic heart disease of native coronary artery without angina pectoris: Secondary | ICD-10-CM

## 2011-03-24 DIAGNOSIS — E785 Hyperlipidemia, unspecified: Secondary | ICD-10-CM

## 2011-03-24 DIAGNOSIS — I10 Essential (primary) hypertension: Secondary | ICD-10-CM

## 2011-03-24 LAB — HEPATIC FUNCTION PANEL
AST: 26 U/L (ref 0–37)
Albumin: 4 g/dL (ref 3.5–5.2)
Total Protein: 6.8 g/dL (ref 6.0–8.3)

## 2011-03-24 LAB — BASIC METABOLIC PANEL
CO2: 29 mEq/L (ref 19–32)
Calcium: 9.2 mg/dL (ref 8.4–10.5)
Chloride: 108 mEq/L (ref 96–112)
Sodium: 142 mEq/L (ref 135–145)

## 2011-03-24 LAB — LIPID PANEL
Cholesterol: 139 mg/dL (ref 0–200)
HDL: 47.9 mg/dL (ref >39.00)
Total CHOL/HDL Ratio: 3
Triglycerides: 65 mg/dL (ref 0.0–149.0)

## 2011-04-05 ENCOUNTER — Other Ambulatory Visit: Payer: Self-pay | Admitting: Cardiology

## 2011-04-20 ENCOUNTER — Encounter: Payer: Self-pay | Admitting: *Deleted

## 2011-05-01 ENCOUNTER — Other Ambulatory Visit: Payer: Self-pay | Admitting: Cardiovascular Disease

## 2011-06-26 ENCOUNTER — Other Ambulatory Visit: Payer: Self-pay | Admitting: Cardiovascular Disease

## 2011-11-28 ENCOUNTER — Other Ambulatory Visit: Payer: Self-pay | Admitting: Cardiovascular Disease

## 2011-11-29 NOTE — Telephone Encounter (Signed)
Fax Received. Refill Completed. Brett Chambers (R.M.A)   

## 2012-02-23 ENCOUNTER — Telehealth: Payer: Self-pay | Admitting: Cardiovascular Disease

## 2012-02-23 MED ORDER — ATORVASTATIN CALCIUM 20 MG PO TABS: 20.0000 mg | ORAL_TABLET | Freq: Every day | ORAL | Status: DC

## 2012-02-23 NOTE — Telephone Encounter (Signed)
PT HAS AN UP COMING APPT IN A WEEK SO JUST  i GAVE PT ENOUGH PILLS UNTILPT COMES IN   FOR APPT

## 2012-02-23 NOTE — Telephone Encounter (Signed)
PT NEEDS REFILL ON LIPITOR 20 MG CALLED INTO [facility] ON [address]

## 2012-02-29 ENCOUNTER — Ambulatory Visit (INDEPENDENT_AMBULATORY_CARE_PROVIDER_SITE_OTHER): Payer: Medicare Other | Admitting: Cardiovascular Disease

## 2012-02-29 ENCOUNTER — Encounter: Payer: Self-pay | Admitting: Cardiovascular Disease

## 2012-02-29 VITALS — BP 144/76 | HR 69 | Ht 72.0 in | Wt 189.0 lb

## 2012-02-29 DIAGNOSIS — E785 Hyperlipidemia, unspecified: Secondary | ICD-10-CM

## 2012-02-29 DIAGNOSIS — E7800 Pure hypercholesterolemia, unspecified: Secondary | ICD-10-CM

## 2012-02-29 DIAGNOSIS — I10 Essential (primary) hypertension: Secondary | ICD-10-CM

## 2012-02-29 DIAGNOSIS — I2510 Atherosclerotic heart disease of native coronary artery without angina pectoris: Secondary | ICD-10-CM

## 2012-02-29 MED ORDER — NITROGLYCERIN 0.4 MG SL SUBL: 0.4000 mg | SUBLINGUAL_TABLET | SUBLINGUAL | Status: DC | PRN

## 2012-02-29 MED ORDER — ATORVASTATIN CALCIUM 20 MG PO TABS: 20.0000 mg | ORAL_TABLET | Freq: Every day | ORAL | Status: DC

## 2012-02-29 MED ORDER — LISINOPRIL 5 MG PO TABS: 5.0000 mg | ORAL_TABLET | Freq: Every day | ORAL | Status: DC

## 2012-02-29 NOTE — Assessment & Plan Note (Signed)
Brett Chambers is doing well. [name] not had any angina-like chest pains. He has occasional episodes of atypical chest pains but these clearly are not related to angina.  [name] been very active. I've asked him to remain active and walk around basis. We'll check fasting labs tomorrow.  I seen again in one year for office visit and fasting labs.

## 2012-02-29 NOTE — Progress Notes (Signed)
Carmelina Peal Date of Birth  [birth year] [facility] [address]    [address] [place], [place]  [address] [phone]  Fax  [phone]  Problem list 1. Coronary artery disease-status post CABG (1994)  and PTCA and stenting 2. Hypertension 3. Hyperlipidemia  History of Present Illness:  Mr. Salzman is a 72 year old gentleman with history of coronary artery disease and coronary artery bypass grafting. He also has had PTCA and stenting. He also has a diagnosis of hypertension and hyperlipidemia.  Mr. Battershell has been very active. He denies any episodes of chest pain or shortness of breath. He comments that his blood pressure is typically elevated when he comes to the doctor's office.  March 01, 2011 He has been having some atypical CP.  These seem to occur when he is relaxing in his lounge chair.  He built a house this past year and never has any cp with exertion.  He remains active but does not have a specific exercise routine.     Current Outpatient Prescriptions on File Prior to Visit  Medication Sig Dispense Refill  . aspirin 325 MG tablet Take 325 mg by mouth daily.        Marland Kitchen atorvastatin (LIPITOR) 20 MG tablet Take 1 tablet (20 mg total) by mouth daily.  15 tablet  0  . clopidogrel (PLAVIX) 75 MG tablet TAKE ONE TABLET BY MOUTH EVERY DAY  30 tablet  3  . lisinopril (PRINIVIL,ZESTRIL) 5 MG tablet TAKE 1 TABLET BY MOUTH EVERY DAY  90 tablet  3    No Known Allergies  Past Medical History  Diagnosis Date  . Coronary artery disease     He status post coronary artery bypass grafting.  Marland Kitchen Hyperlipidemia   . Hypertension     mild hypertension  . White coat hypertension     Past Surgical History  Procedure Date  . Coronary artery bypass graft 1993    RIMA-LAD, SVG-1ST, 2ND DIAG, SVG-1ST, 2 ND OM, PD  . Coronary angioplasty   . Coronary stent placement 7/10    Stent- Left Cirx  . Cardiac catheterization     His most recent in August of 2010 showed totally occluded  LAD,  . US echocardiography 10-29-2007    EF 55-60%    History  Smoking status  . Former Smoker  . Quit date: 02/14/1963  Smokeless tobacco  . Not on file    History  Alcohol Use: Not on file    No family history on file.  Reviw of Systems:  Reviewed in the HPI.  All other systems are negative.  Physical Exam: BP 144/76  Pulse 69  Ht 6' (1.829 m)  Wt 189 lb (85.73 kg)  BMI 25.63 kg/m2  His BP is typically normal at home.  The patient is alert and oriented x 3.  The mood and affect are normal.   Skin: warm and dry.  Color is normal.    HEENT:   Normocephalic/atraumatic. He has normal carotids. No JVD.  Lungs: His lungs are clear.   Heart:  He has a sternotomy.  Regular rate, S1-S2. He has no murmurs.    Abdomen: Good bowel sounds. There is no hepatosplenomegaly.  Extremities:  No clubbing cyanosis or edema.  Neuro:  Nonfocal, his gait is normal.    EKG: 02/29/2012: Normal sinus rhythm at 69 beats a minute. He has occasional premature atrial contractions.  Assessment / Plan:

## 2012-02-29 NOTE — Patient Instructions (Addendum)
Your physician recommends that you return for a FASTING lipid profile: tomorrow anytime from 7:30 - 4 pm you can drink water.  Your physician has recommended you make the following change in your medication: ordered nitro  One tablet under the tongue for chest pain, may take another in 5 minutes if chest pain continues, may take up to 3 times. If pain continues call 911, this med may cause headache and or low blood pressure.   Your physician wants you to follow-up in:12  months  You will receive a reminder letter in the mail two months in advance. If you don't receive a letter, please call our office to schedule the follow-up appointment.

## 2012-02-29 NOTE — Assessment & Plan Note (Signed)
His lipid levels have been well controlled. We will continue with his same medications. We have refilled his medications.

## 2012-03-01 ENCOUNTER — Other Ambulatory Visit (INDEPENDENT_AMBULATORY_CARE_PROVIDER_SITE_OTHER): Payer: Medicare Other

## 2012-03-01 DIAGNOSIS — I2510 Atherosclerotic heart disease of native coronary artery without angina pectoris: Secondary | ICD-10-CM

## 2012-03-01 DIAGNOSIS — E7800 Pure hypercholesterolemia, unspecified: Secondary | ICD-10-CM

## 2012-03-01 LAB — BASIC METABOLIC PANEL
BUN: 22 mg/dL (ref 6–23)
Calcium: 9.2 mg/dL (ref 8.4–10.5)
Creatinine, Ser: 1.1 mg/dL (ref 0.4–1.5)
GFR: 73.03 mL/min (ref >60.00)
Glucose, Bld: 103 mg/dL — ABNORMAL HIGH (ref 70–99)
Potassium: 4.4 mEq/L (ref 3.5–5.1)

## 2012-03-01 LAB — LIPID PANEL
Cholesterol: 141 mg/dL (ref 0–200)
HDL: 42.7 mg/dL (ref >39.00)
LDL Cholesterol: 86 mg/dL (ref 0–99)
Triglycerides: 63 mg/dL (ref 0.0–149.0)
VLDL: 12.6 mg/dL (ref 0.0–40.0)

## 2012-03-01 LAB — HEPATIC FUNCTION PANEL
AST: 25 U/L (ref 0–37)
Albumin: 4.2 g/dL (ref 3.5–5.2)
Total Bilirubin: 0.9 mg/dL (ref 0.3–1.2)

## 2012-03-30 ENCOUNTER — Other Ambulatory Visit: Payer: Self-pay

## 2012-04-05 ENCOUNTER — Telehealth: Payer: Self-pay | Admitting: Cardiovascular Disease

## 2012-04-05 DIAGNOSIS — E785 Hyperlipidemia, unspecified: Secondary | ICD-10-CM

## 2012-04-05 NOTE — Telephone Encounter (Signed)
New Problem    Pt is experiencing a rash. Pt believes it is coming from the Lipitor. Would like an alternative medication. Pt would like to speak to nurse.

## 2012-04-05 NOTE — Telephone Encounter (Signed)
Per wife - pt has broken out in a rash that is "little bitty bumps" and is very itchy.   Also c/o leg cramps.  States he was on Lipitor years ago and this happened before.  He stopped his lipitor about 3 or 4 days ago and it is getting some better.  Advised pt to stay off Lipitor and to take OTC Benadryl as needed for itching.  Of note - pt has been on Crestor in the past which he did well on but was too expensive.  He has also been on Zocor 80 mg  However it was not all that effective per wife.  Aware I will forward this information to Dr Elease Hashimoto and his nurse for review.

## 2012-04-08 MED ORDER — ROSUVASTATIN CALCIUM 10 MG PO TABS: 10.0000 mg | ORAL_TABLET | Freq: Every day | ORAL | Status: DC

## 2012-04-08 NOTE — Telephone Encounter (Signed)
It appears that he is allergic to Lipitor.  Have him stop.  Have him try Crestor 10 mg a day.  Check labs in 3 months

## 2012-04-08 NOTE — Telephone Encounter (Signed)
Pt stopped lipitor, rash less now and itching has stopped. Pt accepting of new med and 3 month lab date given.

## 2012-04-12 ENCOUNTER — Other Ambulatory Visit: Payer: Self-pay | Admitting: *Deleted

## 2012-04-12 MED ORDER — CLOPIDOGREL BISULFATE 75 MG PO TABS: ORAL_TABLET | ORAL | Status: DC

## 2012-06-04 ENCOUNTER — Encounter: Payer: Self-pay | Admitting: Cardiovascular Disease

## 2012-07-09 ENCOUNTER — Other Ambulatory Visit (INDEPENDENT_AMBULATORY_CARE_PROVIDER_SITE_OTHER): Payer: Medicare Other

## 2012-07-09 ENCOUNTER — Encounter: Payer: Self-pay | Admitting: *Deleted

## 2012-07-09 DIAGNOSIS — E785 Hyperlipidemia, unspecified: Secondary | ICD-10-CM

## 2012-07-09 LAB — HEPATIC FUNCTION PANEL
AST: 30 U/L (ref 0–37)
Alkaline Phosphatase: 78 U/L (ref 39–117)
Total Bilirubin: 0.4 mg/dL (ref 0.3–1.2)

## 2012-07-09 LAB — LIPID PANEL: Total CHOL/HDL Ratio: 3

## 2012-07-09 LAB — BASIC METABOLIC PANEL
CO2: 27 mEq/L (ref 19–32)
Calcium: 9.3 mg/dL (ref 8.4–10.5)
Creatinine, Ser: 1.1 mg/dL (ref 0.4–1.5)
GFR: 72.96 mL/min (ref >60.00)
Glucose, Bld: 87 mg/dL (ref 70–99)

## 2012-08-19 ENCOUNTER — Other Ambulatory Visit: Payer: Self-pay | Admitting: *Deleted

## 2012-08-19 MED ORDER — CLOPIDOGREL BISULFATE 75 MG PO TABS: ORAL_TABLET | ORAL | Status: DC

## 2012-08-19 NOTE — Telephone Encounter (Signed)
Fax Received. Refill Completed. Brett Chambers (R.M.A)   

## 2012-08-20 ENCOUNTER — Telehealth: Payer: Self-pay | Admitting: Cardiovascular Disease

## 2012-08-20 NOTE — Telephone Encounter (Signed)
Returned call to patient's daughter [place]acke she stated father has been having chest pain off and on for appox 1 month or longer.Stated father does not complain.Appointment schedule with Tereso Newcomer PA 08/21/12.

## 2012-08-20 NOTE — Telephone Encounter (Signed)
New Prob    Pts daughter states pt has been having angina and would like to speak to nurse about having her father seen sooner if need be. Please call.

## 2012-08-21 ENCOUNTER — Encounter: Payer: Self-pay | Admitting: Physician Assistant

## 2012-08-21 ENCOUNTER — Ambulatory Visit (INDEPENDENT_AMBULATORY_CARE_PROVIDER_SITE_OTHER)
Admission: RE | Admit: 2012-08-21 | Discharge: 2012-08-21 | Disposition: A | Discharge status: Home / Self Care | Payer: Medicare Other | Source: Ambulatory Visit | Attending: Physician Assistant | Admitting: Physician Assistant

## 2012-08-21 ENCOUNTER — Ambulatory Visit (INDEPENDENT_AMBULATORY_CARE_PROVIDER_SITE_OTHER): Payer: Medicare Other | Admitting: Physician Assistant

## 2012-08-21 VITALS — BP 139/79 | HR 58 | Ht 73.0 in | Wt 188.0 lb

## 2012-08-21 DIAGNOSIS — R079 Chest pain, unspecified: Secondary | ICD-10-CM

## 2012-08-21 DIAGNOSIS — E785 Hyperlipidemia, unspecified: Secondary | ICD-10-CM

## 2012-08-21 DIAGNOSIS — I25810 Atherosclerosis of coronary artery bypass graft(s) without angina pectoris: Secondary | ICD-10-CM

## 2012-08-21 DIAGNOSIS — I10 Essential (primary) hypertension: Secondary | ICD-10-CM

## 2012-08-21 MED ORDER — FAMOTIDINE 10 MG PO TABS: 10.0000 mg | ORAL_TABLET | Freq: Two times a day (BID) | ORAL | Status: DC

## 2012-08-21 NOTE — Patient Instructions (Addendum)
START PEPCID OTC 10 MG 1 TABLET TWICE DAILY TAKE FOR 3-4 WEEKS THEN STOP  PLEASE SCHEDULE TO HAVE AN EXERCISE MYOVIEW; DX 786.50  YOU ARE TO GO TO [facility] ON [address] FROM [facility] TO HAVE A CHEST X-RAY TODAY   PLEASE FOLLOW UP WITH SCOTT WEAVER, PAC IN 2-3 WEEKS SAME DAY DR. Elease Hashimoto IS IN THE OFFICE

## 2012-08-21 NOTE — Progress Notes (Signed)
[address] [place], [place]  [address] Phone: [phone] Fax:  [phone]  Date:  08/21/2012   ID:  Brett Chambers, DOB 08-11-1940, MRN [id number]  PCP:  Kari Baars, MD  Cardiologist:  Dr. Delane Ginger     History of Present Illness: Brett Chambers is a 72 y.o. male who returns for eval of CP.    He has a hx of CAD, s/p CABG in 1994, HTN, HL. Echo 9/9:  EF 55-60%, mild MR, mild TR, mild AI, mild Ao sclerosis.  Myoview 08/2008: Anterolateral ischemia, EF 57%.  LHC 08/2008: LAD occluded, RCA occluded, circumflex 60-70%, OM 95%, SVG-OM occluded, SVG-diagonal occluded, SVG-RCA patent with 40-50% proximal and 40-50% distal, RIMA-LAD patent, EF 60%. PCI: Xience DES x2 to the bifurcation lesion of the circumflex and obtuse marginal. Relook catheterization 09/2008: Stable anatomy, patent circumflex stents. Last seen by Dr. Elease Hashimoto 02/2012.  Over the last 3 mos he has noted occasional CP.  It can occur at any time.  It is very hard for him to describe.  It is sharp at times and "uncomfortable" at other times.  It may be a heavy sensation but he is not sure.  He denies exertional CP.  No assoc dyspnea, nausea, diaphoresis, syncope.  He had one episode of near syncope while helping his wife into the house after coming home from the hospital (recent TKR).  He sat down and felt better.  No orthopnea, PND, edema.  Of note, he had dyspnea and "indigestion" prior to his CABG.  He had no symptoms prior to the bifurcational CFX+OM lesion being stented.  He does note similar symptoms after his PCI in 2010 and relook cath was ok as noted.     Labs (4/14):  K 5.4, Cr 1.1, ALT 23, Hgb 14.8, LDL 91, TSH 4.61 Labs (5/14):  K 4, Cr 1.1, ALT 20, LDL 56, Hgb 14.5  Wt Readings from Last 3 Encounters:  08/21/12 188 lb (85.276 kg)  02/29/12 189 lb (85.73 kg)  12/22/10 182 lb 6.4 oz (82.736 kg)     Past Medical History  Diagnosis Date  . Hyperlipidemia   . Hypertension     mild hypertension  .  White coat hypertension   . Coronary artery disease     a. s/p CABG 1994;  b. Myoview 08/2008: Anterolateral ischemia, EF 57%;  LHC 7/10: LAD occl, RCA occl, CFX 60-70%, OM 95%, S-OM occl, S-Dx occl, S-RCA patent with 40-50% prox and 40-50% dist, RIMA-LAD patent, EF 60%. => PCI: Xience DES x2 to the bifurcation lesion of the circumflex and obtuse marginal. c.  Relook cath 09/2008: Stable anatomy, patent circ    Current Outpatient Prescriptions  Medication Sig Dispense Refill  . aspirin 325 MG tablet Take 325 mg by mouth daily.        . clopidogrel (PLAVIX) 75 MG tablet TAKE ONE TABLET BY MOUTH EVERY DAY  30 tablet  6  . lisinopril (PRINIVIL,ZESTRIL) 5 MG tablet Take 1 tablet (5 mg total) by mouth daily.  90 tablet  3  . nitroGLYCERIN (NITROSTAT) 0.4 MG SL tablet Place 1 tablet (0.4 mg total) under the tongue every 5 (five) minutes as needed for chest pain.  25 tablet  6  . rosuvastatin (CRESTOR) 10 MG tablet Take 20 mg by mouth daily.       No current facility-administered medications for this visit.    Allergies:    Allergies  Allergen Reactions  . Lipitor (Atorvastatin) Itching  and Rash    Social History:  The patient  reports that he quit smoking about 49 years ago. He does not have any smokeless tobacco history on file.   ROS:  Please see the history of present illness.   He has reduced energy.  No snoring hx.     All other systems reviewed and negative.   PHYSICAL EXAM: VS:  BP 139/79  Pulse 58  Ht 6\' 1"  (1.854 m)  Wt 188 lb (85.276 kg)  BMI 24.81 kg/m2 Well nourished, well developed, in no acute distress HEENT: normal Neck: no JVD Vascular :  No carotid bruits.  Cardiac:  normal S1, S2; RRR; no murmur Lungs:  clear to auscultation bilaterally, no wheezing, rhonchi or rales Abd: soft, nontender, no hepatomegaly Ext: no edema Skin: warm and dry Neuro:  CNs 2-12 intact, no focal abnormalities noted  EKG:  NSR, HR 58, PACs, no acute changes.     ASSESSMENT AND  PLAN:  1. Chest Pain:  He has atypical features.  But, he has had very little warning signs in the past with significant CAD.  He is a prior smoker.  Recent labs ok.  Check CXR.  Arrange ETT-Myoview.  Try Pepcid 10 mg bid to see if it helps. 2. Dizziness:  Consider event monitor if recurrent symptoms and myoview is low risk. 3. Hypertension:  Controlled.  Continue current therapy.  4. Hyperlipidemia:  Continue statin. 5. CAD:  Continue ASA, Plavix, statin.  Proceed with myoview as noted. 6. Disposition:  F/u with Dr. Delane Ginger or me in 2-3 weeks.   Signed, Tereso Newcomer, PA-C  08/21/2012 3:23 PM

## 2012-08-22 ENCOUNTER — Telehealth: Payer: Self-pay | Admitting: *Deleted

## 2012-08-22 NOTE — Telephone Encounter (Signed)
pt's wife notified about cxr results and verbalized understanding 

## 2012-08-22 NOTE — Telephone Encounter (Signed)
pt's wife notified about cxr results and verbalized understanding

## 2012-08-22 NOTE — Telephone Encounter (Signed)
Message copied by Tarri Fuller on Thu Aug 22, 2012 10:02 AM ------      Message from: [place], [place] T      Created: Wed Aug 21, 2012  5:03 PM       No acute findings      Continue with current treatment plan.      Tereso Newcomer, PA-C        08/21/2012 5:03 PM ------

## 2012-08-27 ENCOUNTER — Encounter: Payer: Self-pay | Admitting: *Deleted

## 2012-08-27 ENCOUNTER — Ambulatory Visit (HOSPITAL_COMMUNITY): Payer: Medicare Other | Attending: Cardiovascular Disease | Admitting: Radiology

## 2012-08-27 VITALS — BP 132/79 | HR 63 | Ht 73.0 in | Wt 186.0 lb

## 2012-08-27 DIAGNOSIS — R0609 Other forms of dyspnea: Secondary | ICD-10-CM | POA: Insufficient documentation

## 2012-08-27 DIAGNOSIS — R079 Chest pain, unspecified: Secondary | ICD-10-CM

## 2012-08-27 DIAGNOSIS — R55 Syncope and collapse: Secondary | ICD-10-CM | POA: Insufficient documentation

## 2012-08-27 DIAGNOSIS — I10 Essential (primary) hypertension: Secondary | ICD-10-CM | POA: Insufficient documentation

## 2012-08-27 DIAGNOSIS — Z87891 Personal history of nicotine dependence: Secondary | ICD-10-CM | POA: Insufficient documentation

## 2012-08-27 DIAGNOSIS — Z951 Presence of aortocoronary bypass graft: Secondary | ICD-10-CM | POA: Insufficient documentation

## 2012-08-27 DIAGNOSIS — R0789 Other chest pain: Secondary | ICD-10-CM | POA: Insufficient documentation

## 2012-08-27 DIAGNOSIS — I2510 Atherosclerotic heart disease of native coronary artery without angina pectoris: Secondary | ICD-10-CM

## 2012-08-27 DIAGNOSIS — I25810 Atherosclerosis of coronary artery bypass graft(s) without angina pectoris: Secondary | ICD-10-CM

## 2012-08-27 DIAGNOSIS — E785 Hyperlipidemia, unspecified: Secondary | ICD-10-CM | POA: Insufficient documentation

## 2012-08-27 DIAGNOSIS — R0989 Other specified symptoms and signs involving the circulatory and respiratory systems: Secondary | ICD-10-CM | POA: Insufficient documentation

## 2012-08-27 MED ORDER — TECHNETIUM TC 99M SESTAMIBI GENERIC - CARDIOLITE
30.0000 | Freq: Once | INTRAVENOUS | Status: AC | PRN
  Administered 2012-08-27: 30 via INTRAVENOUS

## 2012-08-27 MED ORDER — TECHNETIUM TC 99M SESTAMIBI GENERIC - CARDIOLITE
10.0000 | Freq: Once | INTRAVENOUS | Status: AC | PRN
  Administered 2012-08-27: 10 via INTRAVENOUS

## 2012-08-27 NOTE — Telephone Encounter (Signed)
This encounter was created in error - please disregard.

## 2012-08-27 NOTE — Progress Notes (Signed)
[facility] SITE 3 NUCLEAR MED [address] [place], [place] [address] [phone]    Cardiology Nuclear Med Study  Brett Chambers is a 72 y.o. male     MRN : [id number]     DOB: 05/10/1940  Procedure Date: 08/27/2012  Nuclear Med Background Indication for Stress Test:  Evaluation for Ischemia and Graft Patency History:  '94 CABG '10 MPS: EF: 57% Anterolateral ischemia-Cath, 7/10 Heart Cath:  CFX 60-70% OM 90%-Stent--SVG-Diag:Occluded, SVG-RCA patent,RIMA-LAD  EF: 60%  Recath 8/10 patent stent  '09 ECHO: EF: 55-60% mild MR,TR Cardiac Risk Factors: History of Smoking, Hypertension and Lipids  Symptoms:  Chest Pain, Chest Pressure.  (last date of chest discomfort August 26, 2012 Heaviness and discomfort also), DOE and Near Syncope   Nuclear Pre-Procedure Caffeine/Decaff Intake:  None NPO After: 9:00pm   Lungs:  clear O2 Sat: 98% on room air. IV 0.9% NS with Angio Cath:  22g  IV Site: R Hand  IV Started by:  Cathlyn Parsons, RN  Chest Size (in):  44 Cup Size: n/a  Height: 6\' 1"  (1.854 m)  Weight:  186 lb (84.369 kg)  BMI:  Body mass index is 24.55 kg/(m^2). Tech Comments:  Pictures checked with this patient, he had a very abnormal EKG.    Nuclear Med Study 1 or 2 day study: 1 day  Stress Test Type:  Stress  Reading MD: Charlton Haws, MD  Order Authorizing Provider:  Jannette Spanner  Resting Radionuclide: Technetium 85m Sestamibi  Resting Radionuclide Dose: 11.0 mCi   Stress Radionuclide:  Technetium 72m Sestamibi  Stress Radionuclide Dose: 33.0 mCi           Stress Protocol Rest HR: 50 Stress HR: 133  Rest BP: 132/79 Stress BP: 155/78  Exercise Time (min): 9:30 METS: 10.90   Predicted Max HR: 148 bpm % Max HR: 89.86 bpm Rate Pressure Product: 13244   Dose of Adenosine (mg):  n/a Dose of Lexiscan: n/a mg  Dose of Atropine (mg): n/a Dose of Dobutamine: n/a mcg/kg/min (at max HR)  Stress Test Technologist: Milana Na, EMT-P  Nuclear Technologist:   Domenic Polite, CNMT     Rest Procedure:  Myocardial perfusion imaging was performed at rest 45 minutes following the intravenous administration of Technetium 43m Sestamibi. Rest ECG: NSR - Normal EKG  Stress Procedure:  The patient exercised on the treadmill utilizing the Bruce Protocol for 9:30 minutes. The patient stopped due to fatigue and denied any chest pain.  Technetium 77m Sestamibi was injected at peak exercise and myocardial perfusion imaging was performed after a brief delay. Stress ECG: Greater than 2mm ST segment depression inferolateral leads  QPS Raw Data Images:  Normal; no motion artifact; normal heart/lung ratio. Stress Images:  Ischemia anterolateral wall and inferior infarct Rest Images:  inferior infarct Subtraction (SDS):  SDS 13 abnormal anterolateral wall Transient Ischemic Dilatation (Normal <1.22):NA Lung/Heart Ratio (Normal <0.45):  0.46  Quantitative Gated Spect Images QGS EDV: NA QGS ESV: NA  Impression Exercise Capacity:  Good exercise capacity. BP Response:  Hypotensive blood pressure response. Clinical Symptoms:  No significant symptoms noted. 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  LV Ejection Fraction: Study not gated.  LV Wall Motion: NOT GATED

## 2012-08-28 ENCOUNTER — Encounter: Payer: Self-pay | Admitting: *Deleted

## 2012-08-28 ENCOUNTER — Encounter: Payer: Self-pay | Admitting: Cardiovascular Disease

## 2012-08-28 ENCOUNTER — Ambulatory Visit (INDEPENDENT_AMBULATORY_CARE_PROVIDER_SITE_OTHER): Payer: Medicare Other | Admitting: Cardiovascular Disease

## 2012-08-28 VITALS — BP 148/86 | HR 62 | Ht 73.0 in | Wt 186.0 lb

## 2012-08-28 DIAGNOSIS — Z951 Presence of aortocoronary bypass graft: Secondary | ICD-10-CM

## 2012-08-28 DIAGNOSIS — R079 Chest pain, unspecified: Secondary | ICD-10-CM

## 2012-08-28 DIAGNOSIS — R9439 Abnormal result of other cardiovascular function study: Secondary | ICD-10-CM

## 2012-08-28 DIAGNOSIS — I2510 Atherosclerotic heart disease of native coronary artery without angina pectoris: Secondary | ICD-10-CM

## 2012-08-28 LAB — BASIC METABOLIC PANEL
Calcium: 9.7 mg/dL (ref 8.4–10.5)
Creatinine, Ser: 1.1 mg/dL (ref 0.4–1.5)
GFR: 72.93 mL/min (ref >60.00)
Glucose, Bld: 69 mg/dL — ABNORMAL LOW (ref 70–99)
Sodium: 138 mEq/L (ref 135–145)

## 2012-08-28 LAB — CBC
MCHC: 34 g/dL (ref 30.0–36.0)
MCV: 89.8 fl (ref 78.0–100.0)
RDW: 13.2 % (ref 11.5–14.6)

## 2012-08-28 LAB — PROTIME-INR
INR: 1 ratio (ref 0.8–1.0)
Prothrombin Time: 10.4 s (ref 10.2–12.4)

## 2012-08-28 NOTE — Assessment & Plan Note (Addendum)
Briant presents today with symptoms that are concerning for unstable angina. [name] been having some chest heaviness. A stress Myoview study performed yesterday reveals anterior lateral/distal anterior  ischemia. The study was not gated because of heartbeat irregularties.   We will set him up for cardiac catheterization tomorrow. Hopefully he will have a lesion that can be easily stented. He has already seen Dr. Rexanne Mano in the past and was told that he would be a very high-risk surgical candidate if he needed to go for coronary artery bypass grafting. He has a right internal mammary artery across the sternum to the LAD. This right internal mammary artery could very likely be injured in a subsequent sternotomy.  He has a RIGHT IMA to the LAD.  It may be possible to do the cath from the right radial artery.  We will prep both the right femoral artery and right radial artery for cath.  We discussed the risks, benefits, and options of cardiac catheterization. He stands and agrees to proceed. He is here today with his wife and  daughter Woolum who is an OR Engineer, civil (consulting).

## 2012-08-28 NOTE — Patient Instructions (Signed)
CATH SET FOR TOMORROW FOLLOW LETTER/ LABS TODAY

## 2012-08-28 NOTE — Progress Notes (Signed)
Brett Chambers Date of Birth  01/09/1941 [facility] [address]    [address] [place], [place]  [address] [phone]  Fax  [phone]  Problem list 1. Coronary artery disease-status post CABG (1994)  and PTCA and stenting 2. Hypertension 3. Hyperlipidemia  History of Present Illness:  Brett Chambers is a 72-year-old gentleman with history of coronary artery disease and coronary artery bypass grafting. He also has had PTCA and stenting. He also has a diagnosis of hypertension and hyperlipidemia.  Brett Chambers has been very active. He denies any episodes of chest pain or shortness of breath. He comments that his blood pressure is typically elevated when he comes to the doctor's office.  March 01, 2011 He has been having some atypical CP.  These seem to occur when he is relaxing in his lounge chair.  He built a house this past year and never has any cp with exertion.  He remains active but does not have a specific exercise routine.    August 28, 2012:  Brett Chambers is having some chest heaviness and fullness - at rest or exertion.  He also has sharp ice pick sensation.  The chest heaviness can last from 10 minutes - 1 hours.   He does not take any NTG.    He had a stress myoview that revealed distal anterior lateral ischemia. There is a slight defect in the inferolateral basal segments.  His last  heart catheterization revealed a patent right internal mammary artery to LAD and a severely and diffusely diseased circumflex marginal system had 2 stents placed during his last catheterization in August, 2010.   Current Outpatient Prescriptions on File Prior to Visit  Medication Sig Dispense Refill  . aspirin 325 MG tablet Take 325 mg by mouth daily.        . clopidogrel (PLAVIX) 75 MG tablet TAKE ONE TABLET BY MOUTH EVERY DAY  30 tablet  6  . famotidine (PEPCID AC) 10 MG tablet Take 1 tablet (10 mg total) by mouth 2 (two) times daily.      . lisinopril (PRINIVIL,ZESTRIL) 5 MG tablet Take 1  tablet (5 mg total) by mouth daily.  90 tablet  3  . nitroGLYCERIN (NITROSTAT) 0.4 MG SL tablet Place 1 tablet (0.4 mg total) under the tongue every 5 (five) minutes as needed for chest pain.  25 tablet  6  . rosuvastatin (CRESTOR) 10 MG tablet Take 20 mg by mouth daily.       No current facility-administered medications on file prior to visit.    Allergies  Allergen Reactions  . Lipitor (Atorvastatin) Itching and Rash    Past Medical History  Diagnosis Date  . Hyperlipidemia   . Hypertension     mild hypertension  . White coat hypertension   . Coronary artery disease     a. s/p CABG 1994;  b. Myoview 08/2008: Anterolateral ischemia, EF 57%;  LHC 7/10: LAD occl, RCA occl, CFX 60-70%, OM 95%, S-OM occl, S-Dx occl, S-RCA patent with 40-50% prox and 40-50% dist, RIMA-LAD patent, EF 60%. => PCI: Xience DES x2 to the bifurcation lesion of the circumflex and obtuse marginal. c.  Relook cath 09/2008: Stable anatomy, patent circ    Past Surgical History  Procedure Laterality Date  . Coronary artery bypass graft  1993    RIMA-LAD, SVG-1ST, 2ND DIAG, SVG-1ST, 2 ND OM, PD  . Coronary angioplasty    . Coronary stent placement  7/10    Stent- Left Cirx  .   Cardiac catheterization      His most recent in August of 2010 showed totally occluded LAD,  . Us echocardiography  10-29-2007    EF 55-60%    History  Smoking status  . Former Smoker  . Quit date: 02/14/1963  Smokeless tobacco  . Not on file    History  Alcohol Use: Not on file    No family history on file.  Reviw of Systems:  Reviewed in the HPI.  All other systems are negative.  Physical Exam: BP 148/86  Pulse 62  Ht 6' 1" (1.854 m)  Wt 186 lb (84.369 kg)  BMI 24.55 kg/m2  SpO2 99%  His BP is typically normal at home.  The patient is alert and oriented x 3.  The mood and affect are normal.   Skin: warm and dry.  Color is normal.    HEENT:   Normocephalic/atraumatic. He has normal carotids. No JVD.  Lungs: His  lungs are clear.   Heart:  He has a sternotomy.  Regular rate, S1-S2. He has no murmurs.    Abdomen: Good bowel sounds. There is no hepatosplenomegaly.  Extremities:  No clubbing cyanosis or edema.  Neuro:  Nonfocal, his gait is normal.    EKG:   Assessment / Plan:   

## 2012-08-29 ENCOUNTER — Encounter (HOSPITAL_BASED_OUTPATIENT_CLINIC_OR_DEPARTMENT_OTHER)
Admission: RE | Disposition: A | Discharge status: Hospice | Payer: Self-pay | Source: Ambulatory Visit | Attending: Cardiovascular Disease

## 2012-08-29 ENCOUNTER — Encounter (HOSPITAL_COMMUNITY)
Admission: RE | Disposition: A | Discharge status: Hospice | Payer: Self-pay | Source: Ambulatory Visit | Attending: Cardiovascular Disease

## 2012-08-29 ENCOUNTER — Inpatient Hospital Stay (HOSPITAL_BASED_OUTPATIENT_CLINIC_OR_DEPARTMENT_OTHER)
Admission: RE | Admit: 2012-08-29 | Discharge: 2012-08-29 | Disposition: A | Discharge status: Hospice | Payer: Medicare Other | Source: Ambulatory Visit | Attending: Cardiovascular Disease | Admitting: Cardiovascular Disease

## 2012-08-29 ENCOUNTER — Other Ambulatory Visit: Payer: Self-pay

## 2012-08-29 ENCOUNTER — Ambulatory Visit (HOSPITAL_COMMUNITY)
Admission: RE | Admit: 2012-08-29 | Discharge: 2012-08-30 | Disposition: A | Discharge status: Hospice | Payer: Medicare Other | Source: Ambulatory Visit | Attending: Cardiovascular Disease | Admitting: Cardiovascular Disease

## 2012-08-29 DIAGNOSIS — I2510 Atherosclerotic heart disease of native coronary artery without angina pectoris: Secondary | ICD-10-CM

## 2012-08-29 DIAGNOSIS — Z9861 Coronary angioplasty status: Secondary | ICD-10-CM | POA: Insufficient documentation

## 2012-08-29 DIAGNOSIS — I10 Essential (primary) hypertension: Secondary | ICD-10-CM | POA: Insufficient documentation

## 2012-08-29 DIAGNOSIS — I200 Unstable angina: Secondary | ICD-10-CM | POA: Insufficient documentation

## 2012-08-29 DIAGNOSIS — E785 Hyperlipidemia, unspecified: Secondary | ICD-10-CM | POA: Insufficient documentation

## 2012-08-29 DIAGNOSIS — I25810 Atherosclerosis of coronary artery bypass graft(s) without angina pectoris: Secondary | ICD-10-CM | POA: Insufficient documentation

## 2012-08-29 DIAGNOSIS — Z79899 Other long term (current) drug therapy: Secondary | ICD-10-CM | POA: Insufficient documentation

## 2012-08-29 DIAGNOSIS — I209 Angina pectoris, unspecified: Secondary | ICD-10-CM | POA: Insufficient documentation

## 2012-08-29 DIAGNOSIS — Z7902 Long term (current) use of antithrombotics/antiplatelets: Secondary | ICD-10-CM | POA: Insufficient documentation

## 2012-08-29 DIAGNOSIS — I2582 Chronic total occlusion of coronary artery: Secondary | ICD-10-CM | POA: Insufficient documentation

## 2012-08-29 DIAGNOSIS — R9439 Abnormal result of other cardiovascular function study: Secondary | ICD-10-CM

## 2012-08-29 DIAGNOSIS — D696 Thrombocytopenia, unspecified: Secondary | ICD-10-CM | POA: Insufficient documentation

## 2012-08-29 HISTORY — DX: Thrombocytopenia, unspecified: D69.6

## 2012-08-29 HISTORY — PX: FRACTIONAL FLOW RESERVE WIRE: SHX5839

## 2012-08-29 LAB — POCT ACTIVATED CLOTTING TIME
Activated Clotting Time: 288 seconds
Activated Clotting Time: 293 seconds

## 2012-08-29 SURGERY — JV LEFT HEART CATHETERIZATION WITH CORONARY ANGIOGRAM
Anesthesia: Moderate Sedation

## 2012-08-29 SURGERY — FRACTIONAL FLOW RESERVE WIRE
Anesthesia: LOCAL

## 2012-08-29 MED ORDER — FAMOTIDINE 10 MG PO TABS
10.0000 mg | ORAL_TABLET | Freq: Two times a day (BID) | ORAL | Status: DC
  Filled 2012-08-29 (×3): qty 1

## 2012-08-29 MED ORDER — ASPIRIN 81 MG PO CHEW
324.0000 mg | CHEWABLE_TABLET | ORAL | Status: AC
  Administered 2012-08-29: 324 mg via ORAL

## 2012-08-29 MED ORDER — HEPARIN (PORCINE) IN NACL 2-0.9 UNIT/ML-% IJ SOLN
INTRAMUSCULAR | Status: AC
  Filled 2012-08-29: qty 1500

## 2012-08-29 MED ORDER — CLOPIDOGREL BISULFATE 75 MG PO TABS
75.0000 mg | ORAL_TABLET | Freq: Every day | ORAL | Status: DC
  Administered 2012-08-30: 75 mg via ORAL
  Filled 2012-08-29: qty 1

## 2012-08-29 MED ORDER — ROSUVASTATIN CALCIUM 20 MG PO TABS
20.0000 mg | ORAL_TABLET | Freq: Every day | ORAL | Status: DC
  Administered 2012-08-30: 10:00:00 20 mg via ORAL
  Filled 2012-08-29 (×2): qty 1

## 2012-08-29 MED ORDER — ASPIRIN 325 MG PO TABS
325.0000 mg | ORAL_TABLET | Freq: Every day | ORAL | Status: DC
  Administered 2012-08-30: 325 mg via ORAL
  Filled 2012-08-29 (×2): qty 1

## 2012-08-29 MED ORDER — SODIUM CHLORIDE 0.9 % IV SOLN: 250.0000 mL | INTRAVENOUS | Status: DC | PRN

## 2012-08-29 MED ORDER — ATORVASTATIN CALCIUM 20 MG PO TABS: 20.0000 mg | ORAL_TABLET | Freq: Every day | ORAL | Status: DC

## 2012-08-29 MED ORDER — ACETAMINOPHEN 325 MG PO TABS: 650.0000 mg | ORAL_TABLET | ORAL | Status: DC | PRN

## 2012-08-29 MED ORDER — BIVALIRUDIN 250 MG IV SOLR
INTRAVENOUS | Status: AC
  Filled 2012-08-29: qty 250

## 2012-08-29 MED ORDER — ONDANSETRON HCL 4 MG/2ML IJ SOLN: 4.0000 mg | Freq: Four times a day (QID) | INTRAMUSCULAR | Status: DC | PRN

## 2012-08-29 MED ORDER — NITROGLYCERIN 0.4 MG SL SUBL: 0.4000 mg | SUBLINGUAL_TABLET | SUBLINGUAL | Status: DC | PRN

## 2012-08-29 MED ORDER — FENTANYL CITRATE 0.05 MG/ML IJ SOLN
INTRAMUSCULAR | Status: AC
  Filled 2012-08-29: qty 2

## 2012-08-29 MED ORDER — LIDOCAINE HCL (PF) 1 % IJ SOLN
INTRAMUSCULAR | Status: AC
  Filled 2012-08-29: qty 30

## 2012-08-29 MED ORDER — OXYCODONE-ACETAMINOPHEN 5-325 MG PO TABS: 1.0000 | ORAL_TABLET | ORAL | Status: DC | PRN

## 2012-08-29 MED ORDER — MIDAZOLAM HCL 2 MG/2ML IJ SOLN
INTRAMUSCULAR | Status: AC
  Filled 2012-08-29: qty 2

## 2012-08-29 MED ORDER — MORPHINE SULFATE 2 MG/ML IJ SOLN: 2.0000 mg | INTRAMUSCULAR | Status: DC | PRN

## 2012-08-29 MED ORDER — SODIUM CHLORIDE 0.9 % IV SOLN
INTRAVENOUS | Status: AC
  Administered 2012-08-29: 16:00:00 via INTRAVENOUS

## 2012-08-29 MED ORDER — NITROGLYCERIN 0.2 MG/ML ON CALL CATH LAB
INTRAVENOUS | Status: AC
  Filled 2012-08-29: qty 1

## 2012-08-29 MED ORDER — SODIUM CHLORIDE 0.9 % IJ SOLN: 3.0000 mL | Freq: Two times a day (BID) | INTRAMUSCULAR | Status: DC

## 2012-08-29 MED ORDER — LISINOPRIL 5 MG PO TABS
5.0000 mg | ORAL_TABLET | Freq: Every day | ORAL | Status: DC
  Administered 2012-08-30: 10:00:00 5 mg via ORAL
  Filled 2012-08-29: qty 1

## 2012-08-29 MED ORDER — SODIUM CHLORIDE 0.9 % IV SOLN
1.0000 mL/kg/h | INTRAVENOUS | Status: DC
  Administered 2012-08-29: 1 mL/kg/h via INTRAVENOUS

## 2012-08-29 MED ORDER — ADENOSINE 12 MG/4ML IV SOLN
16.0000 mL | Freq: Once | INTRAVENOUS | Status: DC
  Filled 2012-08-29: qty 16

## 2012-08-29 MED ORDER — SODIUM CHLORIDE 0.9 % IJ SOLN: 3.0000 mL | INTRAMUSCULAR | Status: DC | PRN

## 2012-08-29 NOTE — Progress Notes (Signed)
Lamora's test performed on right hand with negative results. 

## 2012-08-29 NOTE — Interval H&P Note (Signed)
History and Physical Interval Note:  08/29/2012 11:50 AM  Brett Chambers  has presented today for surgery, with the diagnosis of chest pain and abnormal stress  The various methods of treatment have been discussed with the patient and family. After consideration of risks, benefits and other options for treatment, the patient has consented to  Procedure(s): JV LEFT HEART CATHETERIZATION WITH CORONARY ANGIOGRAM (N/A) as a surgical intervention .  The patient's history has been reviewed, patient examined, no change in status, stable for surgery.  I have reviewed the patient's chart and labs.  Questions were answered to the patient's satisfaction.    Cath Lab Visit (complete for each Cath Lab visit)  Clinical Evaluation Leading to the Procedure:   ACS: no  Non-ACS:    Anginal Classification: CCS III  Anti-ischemic medical therapy: No Therapy  Non-Invasive Test Results: High-risk stress test findings: cardiac mortality >3%/year  Prior CABG: Previous CABG         [name]

## 2012-08-29 NOTE — H&P (View-Only) (Signed)
Carmelina Peal Date of Birth  01/22/1941 [facility] [address]    [address] [place], [place]  [address] [phone]  Fax  [phone]  Problem list 1. Coronary artery disease-status post CABG (1994)  and PTCA and stenting 2. Hypertension 3. Hyperlipidemia  History of Present Illness:  Mr. Mccaskey is a 72 year old gentleman with history of coronary artery disease and coronary artery bypass grafting. He also has had PTCA and stenting. He also has a diagnosis of hypertension and hyperlipidemia.  Mr. Friedel has been very active. He denies any episodes of chest pain or shortness of breath. He comments that his blood pressure is typically elevated when he comes to the doctor's office.  March 01, 2011 He has been having some atypical CP.  These seem to occur when he is relaxing in his lounge chair.  He built a house this past year and never has any cp with exertion.  He remains active but does not have a specific exercise routine.    August 28, 2012:  Numa is having some chest heaviness and fullness - at rest or exertion.  He also has sharp ice pick sensation.  The chest heaviness can last from 10 minutes - 1 hours.   He does not take any NTG.    He had a stress myoview that revealed distal anterior lateral ischemia. There is a slight defect in the inferolateral basal segments.  His last  heart catheterization revealed a patent right internal mammary artery to LAD and a severely and diffusely diseased circumflex marginal system had 2 stents placed during his last catheterization in August, 2010.   Current Outpatient Prescriptions on File Prior to Visit  Medication Sig Dispense Refill  . aspirin 325 MG tablet Take 325 mg by mouth daily.        . clopidogrel (PLAVIX) 75 MG tablet TAKE ONE TABLET BY MOUTH EVERY DAY  30 tablet  6  . famotidine (PEPCID AC) 10 MG tablet Take 1 tablet (10 mg total) by mouth 2 (two) times daily.      Marland Kitchen lisinopril (PRINIVIL,ZESTRIL) 5 MG tablet Take 1  tablet (5 mg total) by mouth daily.  90 tablet  3  . nitroGLYCERIN (NITROSTAT) 0.4 MG SL tablet Place 1 tablet (0.4 mg total) under the tongue every 5 (five) minutes as needed for chest pain.  25 tablet  6  . rosuvastatin (CRESTOR) 10 MG tablet Take 20 mg by mouth daily.       No current facility-administered medications on file prior to visit.    Allergies  Allergen Reactions  . Lipitor (Atorvastatin) Itching and Rash    Past Medical History  Diagnosis Date  . Hyperlipidemia   . Hypertension     mild hypertension  . White coat hypertension   . Coronary artery disease     a. s/p CABG 1994;  b. Myoview 08/2008: Anterolateral ischemia, EF 57%;  LHC 7/10: LAD occl, RCA occl, CFX 60-70%, OM 95%, S-OM occl, S-Dx occl, S-RCA patent with 40-50% prox and 40-50% dist, RIMA-LAD patent, EF 60%. => PCI: Xience DES x2 to the bifurcation lesion of the circumflex and obtuse marginal. c.  Relook cath 09/2008: Stable anatomy, patent circ    Past Surgical History  Procedure Laterality Date  . Coronary artery bypass graft  1993    RIMA-LAD, SVG-1ST, 2ND DIAG, SVG-1ST, 2 ND OM, PD  . Coronary angioplasty    . Coronary stent placement  7/10    Stent- Left Cirx  .  Cardiac catheterization      His most recent in August of 2010 showed totally occluded LAD,  . US echocardiography  10-29-2007    EF 55-60%    History  Smoking status  . Former Smoker  . Quit date: 02/14/1963  Smokeless tobacco  . Not on file    History  Alcohol Use: Not on file    No family history on file.  Reviw of Systems:  Reviewed in the HPI.  All other systems are negative.  Physical Exam: BP 148/86  Pulse 62  Ht 6\' 1"  (1.854 m)  Wt 186 lb (84.369 kg)  BMI 24.55 kg/m2  SpO2 99%  His BP is typically normal at home.  The patient is alert and oriented x 3.  The mood and affect are normal.   Skin: warm and dry.  Color is normal.    HEENT:   Normocephalic/atraumatic. He has normal carotids. No JVD.  Lungs: His  lungs are clear.   Heart:  He has a sternotomy.  Regular rate, S1-S2. He has no murmurs.    Abdomen: Good bowel sounds. There is no hepatosplenomegaly.  Extremities:  No clubbing cyanosis or edema.  Neuro:  Nonfocal, his gait is normal.    EKG:   Assessment / Plan:

## 2012-08-29 NOTE — H&P (Signed)
Brett Chambers Date of Birth              02/15/1940 [facility] [address]    [address] [place], [place]  [address] [phone]              Fax  [phone]   Problem list 1. Coronary artery disease-status post CABG (1994)  and PTCA and stenting 2. Hypertension 3. Hyperlipidemia   History of Present Illness:   Brett Chambers is a 72 year old gentleman with history of coronary artery disease and coronary artery bypass grafting. He also has had PTCA and stenting. He also has a diagnosis of hypertension and hyperlipidemia.   Brett Chambers has been very active. He denies any episodes of chest pain or shortness of breath. He comments that his blood pressure is typically elevated when he comes to the doctor's office.   March 01, 2011 He has been having some atypical CP.  These seem to occur when he is relaxing in his lounge chair.  He built a house this past year and never has any cp with exertion.  He remains active but does not have a specific exercise routine.     August 28, 2012:   Brett Chambers is having some chest heaviness and fullness - at rest or exertion.  He also has sharp ice pick sensation.  The chest heaviness can last from 10 minutes - 1 hours.   He does not take any NTG.     He had a stress myoview that revealed distal anterior lateral ischemia. There is a slight defect in the inferolateral basal segments.   His last  heart catheterization revealed a patent right internal mammary artery to LAD and a severely and diffusely diseased circumflex marginal system had 2 stents placed during his last catheterization in August, 2010.      Current Outpatient Prescriptions on File Prior to Visit   Medication  Sig  Dispense  Refill   .  aspirin 325 MG tablet  Take 325 mg by mouth daily.           .  clopidogrel (PLAVIX) 75 MG tablet  TAKE ONE TABLET BY MOUTH EVERY DAY   30 tablet   6   .  famotidine (PEPCID AC) 10 MG tablet  Take 1 tablet (10 mg total) by mouth 2 (two) times  daily.         Marland Kitchen  lisinopril (PRINIVIL,ZESTRIL) 5 MG tablet  Take 1 tablet (5 mg total) by mouth daily.   90 tablet   3   .  nitroGLYCERIN (NITROSTAT) 0.4 MG SL tablet  Place 1 tablet (0.4 mg total) under the tongue every 5 (five) minutes as needed for chest pain.   25 tablet   6   .  rosuvastatin (CRESTOR) 10 MG tablet  Take 20 mg by mouth daily.             No current facility-administered medications on file prior to visit.         Allergies   Allergen  Reactions   .  Lipitor (Atorvastatin)  Itching and Rash         Past Medical History   Diagnosis  Date   .  Hyperlipidemia     .  Hypertension         mild hypertension   .  White coat hypertension     .  Coronary artery disease         a. s/p CABG 1994;  b. Myoview 08/2008: Anterolateral ischemia, EF 57%;  LHC 7/10: LAD occl, RCA occl, CFX 60-70%, OM 95%, S-OM occl, S-Dx occl, S-RCA patent with 40-50% prox and 40-50% dist, RIMA-LAD patent, EF 60%. => PCI: Xience DES x2 to the bifurcation lesion of the circumflex and obtuse marginal. c.  Relook cath 09/2008: Stable anatomy, patent circ         Past Surgical History   Procedure  Laterality  Date   .  Coronary artery bypass graft    1993       RIMA-LAD, SVG-1ST, 2ND DIAG, SVG-1ST, 2 ND OM, PD   .  Coronary angioplasty       .  Coronary stent placement    7/10       Stent- Left Cirx   .  Cardiac catheterization           His most recent in August of 2010 showed totally occluded LAD,   .  US echocardiography    10-29-2007       EF 55-60%         History   Smoking status   .  Former Smoker   .  Quit date:  02/14/1963   Smokeless tobacco   .  Not on file         History   Alcohol Use:  Not on file        No family history on file.   Reviw of Systems:   Reviewed in the HPI.  All other systems are negative.   Physical Exam: BP 148/86  Pulse 62  Ht 6\' 1"  (1.854 m)  Wt 186 lb (84.369 kg)  BMI 24.55 kg/m2  SpO2 99%   His BP is typically normal at  home.   The patient is alert and oriented x 3.  The mood and affect are normal.    Skin: warm and dry.  Color is normal.     HEENT:   Normocephalic/atraumatic. He has normal carotids. No JVD.   Lungs: His lungs are clear.    Heart:  He has a sternotomy.  Regular rate, S1-S2. He has no murmurs.     Abdomen: Good bowel sounds. There is no hepatosplenomegaly.   Extremities:  No clubbing cyanosis or edema.   Neuro:  Nonfocal, his gait is normal.      EKG:     Assessment / Plan:             CAD (coronary artery disease) - Brett Mixer, MD at 08/28/2012  1:06 PM    Status: Linus Orn Related Problem: CAD (coronary artery disease)           Brett Chambers presents today with symptoms that are concerning for unstable angina. [name] been having some chest heaviness. A stress Myoview study performed yesterday reveals anterior lateral/distal anterior  ischemia. The study was not gated because of heartbeat irregularties.    We will set him up for cardiac catheterization tomorrow. Hopefully he will have a lesion that can be easily stented. He has already seen Dr. Rexanne Mano in the past and was told that he would be a very high-risk surgical candidate if he needed to go for coronary artery bypass grafting. He has a right internal mammary artery across the sternum to the LAD. This right internal mammary artery could very likely be injured in a subsequent sternotomy.   He has a RIGHT IMA to the LAD.  It may be possible to do the cath from the right radial artery.  We will prep both the right femoral artery and right radial artery for cath.   We discussed the risks, benefits, and options of cardiac catheterization. He stands and agrees to proceed. He is here today with his wife and  daughter Napoli who is an OR Engineer, civil (consulting).      ADDENDUM: 08/29/2012: Pt has undergone diagnostic cath demonstrating severe LCx stenosis. He had resting angina this morning. Plan transfer to inpatient cath lab to undergo  single vessel PCI. Plan communicated to patient and family who understand.  [name] 08/29/2012 5:11 PM

## 2012-08-29 NOTE — Progress Notes (Signed)
Transported to main cath lab for flow wire procedure.

## 2012-08-29 NOTE — CV Procedure (Signed)
   Cardiac Catheterization Procedure Note  Name: Brett Chambers MRN: [id number] DOB: 23-Aug-1940  Procedure: Left Heart Cath, Selective Coronary Angiography, LV angiography, SVG angiography, RIMA angiography  Indication: Exertional and rest angina, high-risk stress Myoview  Procedural details: The right groin was prepped, draped, and anesthetized with 1% lidocaine. Using modified Seldinger technique, a 5 French sheath was introduced into the right femoral artery. Standard Judkins catheters were used for coronary angiography and left ventriculography. A 3 DRC catheter was used for imaging of the right coronary artery, a JR 4 catheter was used for imaging of the RIMA. Catheter exchanges were performed over a guidewire. There were no immediate procedural complications. The patient was transferred to the post catheterization recovery area for further monitoring.  Procedural Findings: Hemodynamics:  AO 105/49 with a mean of 76 LV 108/14   Coronary angiography: Coronary dominance: right  Left mainstem: The left main is patent with 75% eccentric stenosis at the junction of the left main and ostial left circumflex. The left main is moderately calcified.  Left anterior descending (LAD): Total occlusion at the ostium.  Left circumflex (LCx): There is moderately severe eccentric 75-80% stenosis involving the ostium of the left circumflex. The stented segment which extends from the proximal circumflex into the first obtuse marginal branch is patent. The proximal edge of the stent has 50% stenosis. The continuation of the AV circumflex beyond the first OM appears patent. There is 40% stenosis just before the stented segment in the mid circumflex. The distal AV circumflex into the second obtuse marginal branch is patent.  Right coronary artery (RCA): Total occlusion in the proximal vessel. There is extensive disease throughout with some antegrade filling into the mid circumflex and a second area of total  occlusion in the mid vessel.  Saphenous vein graft to OM: Total occlusion  Saphenous vein graft to diagonal: Total occlusion  Saphenous vein graft to RCA: Total occlusion  RIMA to LAD: Widely patent throughout. The distal anastomotic site is completely intact with no stenosis. The LAD has very minor disease. There is a well formed septal collaterals supplying the PDA and PLA branches of the RCA.  Left ventriculography: Left ventricular systolic function is normal, LVEF is estimated at 55%, there is no significant mitral regurgitation   Final Conclusions:   1. Severe native three-vessel coronary artery disease with chronic total occlusions of the LAD and right coronary arteries. 2. Continued patency of the stented segments in the left circumflex with moderately severe to severe proximal circumflex stenosis 3. Status post aortocoronary bypass surgery with total occlusion of the saphenous vein grafts and continued patency of the RIMA to LAD 4. Preserved left ventricular systolic function  Recommendations: The patient has had interval occlusion of the saphenous vein graft to right coronary artery since his last heart catheterization in 2010. He has also developed severe proximal left circumflex stenosis. Considering his high risk nuclear scan, I think the most prudent treatment is PCI of the left circumflex possibly guided by pressure wire analysis. Consideration of PCI to right coronary artery would involve an extensive chronic total occlusion of very long length. I think this would be extremely challenging from a technical perspective. The patient will be transferred to the inpatient cath lab for PCI of the left circumflex. Of note, he had resting chest discomfort this morning while here in the cath lab.  [name] 08/29/2012, 12:38 PM

## 2012-08-29 NOTE — CV Procedure (Addendum)
   CARDIAC CATH NOTE  Name: Brett Chambers MRN: [id number] DOB: February 01, 1941  Procedure: FFR, PTCA and stenting of the left circumflex  Indication: 72 year old gentleman with extensive CAD. He had remote CABG. In 2002 and he underwent PCI of left circumflex and obtuse marginal branches with stenting in both vessels. He has developed progressive angina with exertion and at rest (CCS class 3/4). He was referred for diagnostic cardiac catheterization this morning. This demonstrated severe stenosis in the proximal left circumflex which supplies a large amount of myocardium and the graft to this vessel are occluded. The patient had a high risk nuclear stress scan prompting his cardiac cath. After review of his clinical symptoms, noninvasive study, and coronary anatomy, we decided to proceed with single vessel PCI directed by pressure wire analysis.  Procedural Details: The right groin was prepped, draped, and anesthetized with 1% lidocaine. There was an indwelling 5 [place] sheath. This was changed out in sterile fashion to a 6 [place] sheath. Weight-based bivalirudin was given for anticoagulation. Once a therapeutic ACT was achieved, a 6 [place] XB 4 cm guide catheter was inserted.  A BMW coronary guidewire was used to cross the lesion. There is a severe stenosis in the proximal circumflex before the stented segment. The stented segment involves a bifurcation of the AV circumflex and first obtuse marginal. The wire was advanced into th and a Navvus pressure catheter was advanced beyond the lesion after equalizing at the guide tip. The FFR was 0.80 even before administration of adenosine. Because of the positive resting FFR, I elected to proceed with PCI. The lesion was predilated with a  2.5 x 15 mm  balloon.  The lesion was then stented with a  3.0 x 15 mm Xience Xpedition drug-eluting stent.  The stent was postdilated with a 3.25 mm noncompliant balloon.  Following PCI, there was 0% residual stenosis and TIMI-3  flow.  there was moderate disease within the previously implanted stent at the junction of the OM and AV circumflex. The BMW wire was repositioned into the obtuse marginal branch and the FFR catheter was readvanced. Intravenous adenosine was initiated. The post procedure FFR was 0.91. I felt this was an acceptable result. Final angiography confirmed an excellent result. The patient tolerated the procedure well. There were no immediate procedural complications. Femoral hemostasis was achieved with  manual pressure . The patient was transferred to the post catheterization recovery area for further monitoring.  Lesion Data: Vessel: LCx/ostial Percent stenosis (pre): 80 TIMI-flow (pre):  3 Stent:  3.0x15 mm Xience DES Percent stenosis (post): 0 TIMI-flow (post): 3  FFR (pre) = 0.80 (without adenosine) FFR (post) = 0.91 (with adenosine)  Conclusions: Successful FFR guided PCI as above  Recommendations: Continue DAPT at least 12 months and favor long-term.  [name] 08/29/2012, 3:55 PM

## 2012-08-30 ENCOUNTER — Encounter (HOSPITAL_COMMUNITY): Payer: Self-pay | Admitting: *Deleted

## 2012-08-30 DIAGNOSIS — I2510 Atherosclerotic heart disease of native coronary artery without angina pectoris: Secondary | ICD-10-CM

## 2012-08-30 LAB — BASIC METABOLIC PANEL
GFR calc Af Amer: >90 mL/min (ref >90)
GFR calc non Af Amer: 81 mL/min — ABNORMAL LOW (ref >90)
Potassium: 4.3 mEq/L (ref 3.5–5.1)
Sodium: 140 mEq/L (ref 135–145)

## 2012-08-30 LAB — CBC
MCHC: 32.7 g/dL (ref 30.0–36.0)
RDW: 13.1 % (ref 11.5–15.5)

## 2012-08-30 MED ORDER — METOPROLOL TARTRATE 25 MG PO TABS: 12.5000 mg | ORAL_TABLET | Freq: Two times a day (BID) | ORAL | Status: DC

## 2012-08-30 MED ORDER — ASPIRIN 81 MG PO TABS: 81.0000 mg | ORAL_TABLET | Freq: Every day | ORAL | Status: DC

## 2012-08-30 MED FILL — Sodium Chloride IV Soln 0.9%: INTRAVENOUS | Qty: 50 | Status: AC

## 2012-08-30 NOTE — Progress Notes (Signed)
    Subjective:  No chest pain or dyspnea. Just walked with cardiac rehab.  Objective:  Vital Signs in the last 24 hours: Temp:  [97.6 F (36.4 C)-98.2 F (36.8 C)] 98.2 F (36.8 C) (07/18 0734) Pulse Rate:  [51-68] 67 (07/18 0734) Resp:  [16-20] 18 (07/18 0734) BP: (107-173)/(61-111) 107/66 mmHg (07/18 0734) SpO2:  [92 %-99 %] 93 % (07/18 0734) Weight:  [83.2 kg (183 lb 6.8 oz)-84.369 kg (186 lb)] 83.2 kg (183 lb 6.8 oz) (07/18 0300)  Intake/Output from previous day: 07/17 0701 - 07/18 0700 In: 296.3 [I.V.:296.3] Out: 550 [Urine:550]  Physical Exam: Pt is alert and oriented, NAD HEENT: normal Neck: JVP - normal Lungs: CTA bilaterally CV: RRR without murmur or gallop Abd: soft, NT, Positive BS, no hepatomegaly Ext: no C/C/E, distal pulses intact and equal, right groin site clear Skin: warm/dry no rash   Lab Results:  Recent Labs  08/28/12 1238 08/30/12 0425  WBC 7.9 7.6  HGB 14.8 13.6  PLT 118.0* 111*    Recent Labs  08/28/12 1238 08/30/12 0425  NA 138 140  K 4.8 4.3  CL 105 105  CO2 28 27  GLUCOSE 69* 82  BUN 14 12  CREATININE 1.1 0.97   No results found for this basename: TROPONINI, CK, MB,  in the last 72 hours  Tele: Sinus rhythm, frequent PAC's  Assessment/Plan:  CAD s/p CABG and PCI - CCS class 3/4 angina now s/p PCI of the left circumflex. Continue current medical program, except recommend add beta blocker at low-dose. Pt on plavix, ASA, statin, and ACE-I. Follow-up Dr Nahser/APP 2 weeks.  [name], M.D. 08/30/2012, 8:36 AM

## 2012-08-30 NOTE — Progress Notes (Signed)
Site area: right groin  Site Prior to Removal:  Level 0  Pressure Applied For 25 MINUTES    Minutes Beginning at 18:00  Manual:   yes  Patient Status During Pull:  WNL  Post Pull Groin Site:  Level 0  Post Pull Instructions Given:  yes  Post Pull Pulses Present:  yes  Dressing Applied:  yes  Comments:  No hematoma; V/S stable

## 2012-08-30 NOTE — Discharge Summary (Signed)
Discharge Summary   Patient ID: WINTER TREFZ MRN: [id number], DOB/AGE: Jun 15, 1940 72 y.o. Admit date: 08/29/2012 D/C date:     08/30/2012  Primary Cardiologist: Nahser  Primary Discharge Diagnoses:  1. CAD with class 3/4 angina - abnl myoview -> cath 08/29/12 with interval occ of SVG-RCA, CTO of LAD/RCA, severe prox LCx stenosis s/p FFR-guided DES to LCx - s/p CABG 1994, cath 08/2008 following abnormal myoview with resultant  DES x2 to the bifurcation lesion of the circumflex and obtuse marginal 2. HTN 3. Hyperlipidemia 4. Thrombocytopenia incidentally noted  Hospital Course:  Mr. Usman is a 72 y/o M with history of CAD s/p CABG in 1994 & prior stenting, HTN, Hl who presented to the office recently with complaints of chest heaviness and fullness. This has occurred at rest or exertion. He also reported a sharp ice pick sensation. The chest heaviness typically lasted from 10 min to 1 hour. He underwent outpatient stress testing which revealed distal anterior lateral ischemia. There is a slight defect in the inferolateral basal segments. It has previously been known that he was told by Dr. Laneta Simmers that he would be very high risk surgical candidate if he needed to go for CABG. (He has a right internal mammary artery across the sternum to the LAD. This right internal mammary artery could very likely be injured in a subsequent sternotomy.) Cardiac cath was recommended. He was brought in for this procedure yesterday. It demonstrated: 1. Severe native three-vessel coronary artery disease with chronic total occlusions of the LAD and right coronary arteries.  2. Continued patency of the stented segments in the left circumflex with moderately severe to severe proximal circumflex stenosis  3. Status post aortocoronary bypass surgery with total occlusion of the saphenous vein grafts and continued patency of the RIMA to LAD  4. Preserved left ventricular systolic function The patient has had interval occlusion  of the saphenous vein graft to right coronary artery since his last heart catheterization in 2010. He has also developed severe prox LCx stenosis as noted above. Considering his high risk nuclear scan, PCI of the LCx was recommended. The patient subsequently underwent FFR-guided PCI/DES placement to the left circumflex. It is recommended that he continue DAPT at least 12 months and favor long-term. Dr. Excell Seltzer mentions in his cath note that consideration of PCI to right coronary artery would involve an extensive chronic total occlusion of very long length - he thinks this would be extremely challenging from a technical perspective.  Today the patient is feeling well. He has ambulated with cardiac rehab. Low dose beta blocker was added. Dr. Excell Seltzer has seen and examined the patient today and feels he is stable for discharge.  Of note, platelet count was noted to be mildly low this admission. He was instructed to contact PCP to discuss further evaluation if this has not previously been evaluated. His platelet count was very similar in 2010.  Discharge Vitals: Blood pressure 107/66, pulse 67, temperature 98.2 F (36.8 C), temperature source Oral, resp. rate 18, weight 183 lb 6.8 oz (83.2 kg), SpO2 93.00%.  Labs: Lab Results  Component Value Date   WBC 7.6 08/30/2012   HGB 13.6 08/30/2012   HCT 41.6 08/30/2012   MCV 88.9 08/30/2012   PLT 111* 08/30/2012     Recent Labs Lab 08/30/12 0425  NA 140  K 4.3  CL 105  CO2 27  BUN 12  CREATININE 0.97  CALCIUM 9.2  GLUCOSE 82    Lab Results  Component  Value Date   CHOL 114 07/09/2012   HDL 39.70 07/09/2012   LDLCALC 56 07/09/2012   TRIG 94.0 07/09/2012    Diagnostic Studies/Procedures   Cardiac catheterization this admission, please see full report and above for summary.   Discharge Medications     Medication List         aspirin 81 MG tablet  Take 1 tablet (81 mg total) by mouth daily.     clopidogrel 75 MG tablet  Commonly known as:   PLAVIX  TAKE ONE TABLET BY MOUTH EVERY DAY     lisinopril 5 MG tablet  Commonly known as:  PRINIVIL,ZESTRIL  Take 1 tablet (5 mg total) by mouth daily.     metoprolol tartrate 25 MG tablet  Commonly known as:  LOPRESSOR  Take 0.5 tablets (12.5 mg total) by mouth 2 (two) times daily.     nitroGLYCERIN 0.4 MG SL tablet  Commonly known as:  NITROSTAT  Place 1 tablet (0.4 mg total) under the tongue every 5 (five) minutes as needed for chest pain.     rosuvastatin 10 MG tablet  Commonly known as:  CRESTOR  Take 20 mg by mouth daily.        Disposition   The patient will be discharged in stable condition to home. Discharge Orders   Future Appointments Provider Department Dept Phone   09/13/2012 10:30 AM Minda Meo, PA-C [facility] Main Office [place]) [phone]   Future Orders Complete By Expires     Diet - low sodium heart healthy  As directed     Increase activity slowly  As directed     Comments:      No driving for 2 days. No lifting over 5 lbs for 1 week. No sexual activity for 1 week. Keep procedure site clean & dry. If you notice increased pain, swelling, bleeding or pus, call/return!  You may shower, but no soaking baths/hot tubs/pools for 1 week.      Follow-up Information   Follow up with Kari Baars, MD. (Your platelet count (one of the components of blood) was mildly low. Please contact primary doctor to discuss further workup/monitoring if this has not been evaluated before.)    Contact information:   [address] [facility] [place] [address] [phone]       Follow up with Rick Duff, PA-C. (09/13/12 at 10:30am)    Contact information:   [address] [place] [address] [phone]         Duration of Discharge Encounter: Greater than 30 minutes including physician and PA time.  Signed, Kaili Castille PA-C 08/30/2012, 11:28 AM

## 2012-08-30 NOTE — Progress Notes (Signed)
CARDIAC REHAB PHASE I   PRE:  Rate/Rhythm: 61SR  BP:  Supine: 107/66  Sitting:   Standing:    SaO2:   MODE:  Ambulation: 1000 ft   POST:  Rate/Rhythm: 74SR PACs  BP:  Supine:   Sitting: 142/94  Standing:    SaO2:  0750-0847 Pt walked 1000 ft on RA with steady gait. Denied CP. Tolerated well. Education completed. Discussed CRP 2. Pt declined. Encouraged pt to get in to regular exercise. Gave walking instructions.   Luetta Nutting, RN BSN  08/30/2012 8:43 AM

## 2012-09-02 ENCOUNTER — Telehealth: Payer: Self-pay | Admitting: Cardiovascular Disease

## 2012-09-02 MED ORDER — ISOSORBIDE MONONITRATE 15 MG HALF TABLET: 15.0000 mg | ORAL_TABLET | Freq: Every day | ORAL | Status: DC

## 2012-09-02 NOTE — Telephone Encounter (Signed)
New problem    pts daughter has a question about pt having chest pains-wondering if cath procedure has anything to do with pains

## 2012-09-02 NOTE — Telephone Encounter (Signed)
Pt had cpx 1 that went away after 1 x nitro use.  Had stent last week Thursday 08/29/12 to circumflex. Told his daughter I will forward a msg to Dr Elease Hashimoto. Told her to call back with further cp and to go to ED if in evening or pain unresolved. Agreed to plan.

## 2012-09-02 NOTE — Telephone Encounter (Signed)
Spoke with Dr Excell Seltzer, pt can start Imdur 15 mg daily. Daughter was informed, told to continue to monitor and call with further questions or concerns.

## 2012-09-02 NOTE — Telephone Encounter (Signed)
Follow up  Pt daughter wants to speak with you regarding her father

## 2012-09-04 ENCOUNTER — Ambulatory Visit: Payer: Medicare Other | Admitting: Nurse Practitioner

## 2012-09-05 NOTE — Telephone Encounter (Signed)
Follow Up     Calling regarding pt prior chest pain. Please call.

## 2012-09-05 NOTE — Telephone Encounter (Signed)
Per daughter/ still not feeling well, bp low at times, pt taking Imdur but does not seem to be helpful, not taking nitro.  Pt is carrying out ADL's but is not feeling well. Request sooner app. App given for tomorrow w/ PA Alben Spittle, accepting of plan.

## 2012-09-06 ENCOUNTER — Ambulatory Visit (INDEPENDENT_AMBULATORY_CARE_PROVIDER_SITE_OTHER): Payer: Medicare Other | Admitting: Physician Assistant

## 2012-09-06 ENCOUNTER — Encounter: Payer: Self-pay | Admitting: Physician Assistant

## 2012-09-06 VITALS — BP 130/82 | HR 58 | Ht 73.0 in | Wt 185.0 lb

## 2012-09-06 DIAGNOSIS — I2510 Atherosclerotic heart disease of native coronary artery without angina pectoris: Secondary | ICD-10-CM

## 2012-09-06 DIAGNOSIS — E785 Hyperlipidemia, unspecified: Secondary | ICD-10-CM

## 2012-09-06 DIAGNOSIS — R079 Chest pain, unspecified: Secondary | ICD-10-CM

## 2012-09-06 DIAGNOSIS — I10 Essential (primary) hypertension: Secondary | ICD-10-CM

## 2012-09-06 NOTE — Patient Instructions (Addendum)
PLEASE FOLLOW UP WITH Brett Chambers, PAC SAME DAY WITH DR. Excell Seltzer IN THE OFFICE  ON 09/18/12 @ 10:50  STOP LISINOPRIL

## 2012-09-06 NOTE — Progress Notes (Addendum)
[address], [address] [place], [place]  [address] Phone: [phone] Fax:  [phone]  Date:  09/06/2012   ID:  ANDERSEN IORIO, DOB 1941-02-05, MRN [id number]  PCP:  Kari Baars, MD  Cardiologist:  Dr. Delane Ginger     History of Present Illness: Brett Chambers is a 72 y.o. male who returns for f/u.  He has a hx of CAD, s/p CABG in 1994, HTN, HL. Echo 9/9: EF 55-60%, mild MR, mild TR, mild AI, mild Ao sclerosis. Myoview 08/2008: Anterolateral ischemia, EF 57%. LHC 08/2008: LAD occluded, RCA occluded, circumflex 60-70%, OM 95%, SVG-OM occluded, SVG-diagonal occluded, SVG-RCA patent with 40-50% proximal and 40-50% distal, RIMA-LAD patent, EF 60%. PCI: Xience DES x2 to the bifurcation lesion of the CFX and OM. Relook catheterization 09/2008: Stable anatomy, patent circumflex stents.   Patient was seen in the office recently with chest pain. Stress testing was arranged.  Nuclear images were high risk with anterolateral ischemia. He was therefore setup for cardiac catheterization. LHC 08/29/12: LM and oCFX junction 75%, ostial LAD occluded, oCFX 75-80%, CFX stents patent with 50% ISR at prox edge, 40% before stented segment in mid CFX, pRCA occluded, S-OM occluded, S-Dx occluded, S-RCA occluded, RIMA-LAD patent, EF 55%. It was noted that the patient had an interval occlusion of the S-RCA since his last heart cath in 2010 and severe proximal CFX stenosis. Patient underwent FFR guided PCI with placement of a Xience DES to the ostial CFX.  Of note, it was felt that PCI of the RCA would involve an extensive CTO of very long length and would be technically challenging.  Post PCI course was uneventful.  DAPT recommended x 1 year.  Patient called in with recent CP since PCI.  Imdur 15 mg QD started.  Patient called in again not feeling well and stating BP low and not feeling well.  So, he was placed on my scheduled today. Patient notes worse CP since he left the hospital.  Symptoms continue to be  atypical.  He does not get CP during strenuous activity.  He notes it after activity and with bending over.  Pain may last 20 minutes.  No assoc SOB, nausea or diaphoresis.  He thinks NTG has helped.  Has not noted improvement with Imdur.  Feels fatigued.  He felt like this prior to his PCI.  No syncope.  Does note some dizziness at times.  No orthopnea, PND, edema.  No significant SOB.  BPs at home have been low (90s/60s).  He did not take Lisinopril today and feels somewhat better.    Labs (4/14): K 5.4, Cr 1.1, ALT 23, Hgb 14.8, LDL 91, TSH 4.61  Labs (5/14): K 4, Cr 1.1, ALT 20, LDL 56, Hgb 14.5  Labs (7/14): K 4.3, Cr 0.97, Hgb 13.6, PLT 111K,   Wt Readings from Last 3 Encounters:  09/06/12 185 lb (83.915 kg)  08/30/12 183 lb 6.8 oz (83.2 kg)  08/29/12 186 lb (84.369 kg)     Past Medical History  Diagnosis Date  . Hyperlipidemia   . Hypertension     mild hypertension  . Coronary artery disease     a. s/p CABG 1994;  B. Abnl myoview 08/2008: Anterolateral ischemia, EF 57%;  LHC 7/10: Xience DES x2 to the bifurcation lesion of the circumflex and obtuse marginal. c. Abnl myoview 08/2012-> cath with interval occ of SVG-RCA, CTO of LAD/RCA, severe prox LCx stenosis s/p FFR-guided DES to LCx  . Thrombocytopenia  a. Noted on labs 08/2012.    Current Outpatient Prescriptions  Medication Sig Dispense Refill  . aspirin 325 MG tablet Take 325 mg by mouth daily.      . clopidogrel (PLAVIX) 75 MG tablet TAKE ONE TABLET BY MOUTH EVERY DAY  30 tablet  6  . isosorbide mononitrate (IMDUR) 15 mg TB24 Take 0.5 tablets (15 mg total) by mouth daily.  15 tablet  6  . lisinopril (PRINIVIL,ZESTRIL) 5 MG tablet Take 1 tablet (5 mg total) by mouth daily.  90 tablet  3  . metoprolol tartrate (LOPRESSOR) 25 MG tablet Take 0.5 tablets (12.5 mg total) by mouth 2 (two) times daily.  60 tablet  6  . nitroGLYCERIN (NITROSTAT) 0.4 MG SL tablet Place 1 tablet (0.4 mg total) under the tongue every 5 (five) minutes as  needed for chest pain.  25 tablet  6  . rosuvastatin (CRESTOR) 20 MG tablet Take 20 mg by mouth daily.       No current facility-administered medications for this visit.    Allergies:    Allergies  Allergen Reactions  . Lipitor (Atorvastatin) Itching and Rash    Social History:  The patient  reports that he quit smoking about 49 years ago. He does not have any smokeless tobacco history on file.   ROS:  Please see the history of present illness.   No melena, hematochezia, vomiting or diarrhea.   All other systems reviewed and negative.   PHYSICAL EXAM: VS:  BP 130/82  Pulse 58  Ht 6\' 1"  (1.854 m)  Wt 185 lb (83.915 kg)  BMI 24.41 kg/m2 Well nourished, well developed, in no acute distress HEENT: normal Neck: no JVD Cardiac:  normal S1, S2; RRR; no murmur Lungs:  clear to auscultation bilaterally, no wheezing, rhonchi or rales Abd: soft, nontender, no hepatomegaly Ext: no edema; right groin without hematoma or bruit  Skin: warm and dry Neuro:  CNs 2-12 intact, no focal abnormalities noted  EKG:  NSR, HR 58, normal axis, PACS, NSSTTW changes     ASSESSMENT AND PLAN:  1. Chest Pain:  He is having similar symptoms as he had prior to his PCI.  Question if this is related to occlusion of the RCA and S-RCA.  As noted, this would be a technically challenging area to treat with PCI.  Some symptoms he describes may be related to low BP as well.  Ok to hold his ACE for now and continue metoprolol and isosorbide.  Discussed with Dr. [name].  He also saw the patient.  Options at this point are to follow up with him closely after being off his Lisinopril vs proceeding with a relook heart cath.  After further discussion, we have opted to observe off Lisinopril.  He will remain off Lisinopril and continue metoprolol and Imdur. 2. CAD:  Continue ASA, Plavix and statin.   3. Hypertension: BPs running low.  Will hold ACE for now. 4. Hyperlipidemia:  Continue statin.   5. Disposition:   F/u  with Dr. Delane Ginger or me in 2-3 weeks.  If he has continued pain, he will need a relook LHC.  Signed, Tereso Newcomer, PA-C  09/06/2012 2:31 PM

## 2012-09-06 NOTE — Telephone Encounter (Signed)
Patient seen today.  See OV note. Tereso Newcomer, PA-C   09/06/2012 4:59 PM

## 2012-09-09 NOTE — Telephone Encounter (Signed)
Called pt to check up on/ not at home. Called cell and discussed  bp 107/78 p 42-48, no energy but wants to wait 2 weeks to see if med change helps. He will call back if feels worse. Has f/u app set.

## 2012-09-10 ENCOUNTER — Ambulatory Visit: Payer: Medicare Other | Admitting: Nurse Practitioner

## 2012-09-13 ENCOUNTER — Encounter: Payer: Medicare Other | Admitting: Cardiology

## 2012-09-18 ENCOUNTER — Other Ambulatory Visit: Payer: Self-pay

## 2012-09-18 ENCOUNTER — Ambulatory Visit (INDEPENDENT_AMBULATORY_CARE_PROVIDER_SITE_OTHER): Payer: Medicare Other | Admitting: Physician Assistant

## 2012-09-18 ENCOUNTER — Encounter: Payer: Self-pay | Admitting: Physician Assistant

## 2012-09-18 VITALS — BP 132/69 | HR 42 | Ht 73.0 in | Wt 188.0 lb

## 2012-09-18 DIAGNOSIS — I10 Essential (primary) hypertension: Secondary | ICD-10-CM

## 2012-09-18 DIAGNOSIS — E785 Hyperlipidemia, unspecified: Secondary | ICD-10-CM

## 2012-09-18 DIAGNOSIS — R079 Chest pain, unspecified: Secondary | ICD-10-CM

## 2012-09-18 DIAGNOSIS — I498 Other specified cardiac arrhythmias: Secondary | ICD-10-CM

## 2012-09-18 DIAGNOSIS — I2510 Atherosclerotic heart disease of native coronary artery without angina pectoris: Secondary | ICD-10-CM

## 2012-09-18 DIAGNOSIS — R42 Dizziness and giddiness: Secondary | ICD-10-CM

## 2012-09-18 DIAGNOSIS — R001 Bradycardia, unspecified: Secondary | ICD-10-CM

## 2012-09-18 NOTE — Patient Instructions (Addendum)
STOP METOPROLOL  Your physician has recommended that you wear an event monitor. Event monitors are medical devices that record the heart's electrical activity. Doctors most often [place] these monitors to diagnose arrhythmias. Arrhythmias are problems with the speed or rhythm of the heartbeat. The monitor is a small, portable device. You can wear one while you do your normal daily activities. This is usually used to diagnose what is causing palpitations/syncope (passing out).  Your physician recommends that you schedule a follow-up appointment in: 4 weeks with Dr.Nahser/or Lilian Coma on a day that Dr.Nahser is in the office

## 2012-09-18 NOTE — Progress Notes (Signed)
[address] [place], [place]  [address] Phone: [phone] Fax:  [phone]  Date:  09/18/2012   ID:  Brett Chambers, DOB 14-Aug-1940, MRN [id number]  PCP:  Kari Baars, MD  Cardiologist:  Dr. Delane Ginger     History of Present Illness: Brett Chambers is a 72 y.o. male who returns for f/u.  He has a hx of CAD, s/p CABG in 1994, HTN, HL. Echo 9/9: EF 55-60%, mild MR, mild TR, mild AI, mild Ao sclerosis. Myoview 08/2008: Anterolateral ischemia, EF 57%. LHC 08/2008: LAD occluded, RCA occluded, circumflex 60-70%, OM 95%, SVG-OM occluded, SVG-diagonal occluded, SVG-RCA patent with 40-50% proximal and 40-50% distal, RIMA-LAD patent, EF 60%. PCI: Xience DES x2 to the bifurcation lesion of the CFX and OM. Relook catheterization 09/2008: Stable anatomy, patent circumflex stents.   Patient was seen in the office recently with chest pain. Stress testing was arranged.  Nuclear images were high risk with anterolateral ischemia. He was therefore setup for cardiac catheterization. LHC 08/29/12: LM and oCFX junction 75%, ostial LAD occluded, oCFX 75-80%, CFX stents patent with 50% ISR at prox edge, 40% before stented segment in mid CFX, pRCA occluded, S-OM occluded, S-Dx occluded, S-RCA occluded, RIMA-LAD patent, EF 55%. It was noted that the patient had an interval occlusion of the S-RCA since his last heart cath in 2010 and severe proximal CFX stenosis. Patient underwent FFR guided PCI with placement of a Xience DES to the ostial CFX.  Of note, it was felt that PCI of the RCA would involve an extensive CTO of very long length and would be technically challenging.  Post PCI course was uneventful.  DAPT recommended x 1 year.  Patient called in with recent CP since PCI.  Imdur 15 mg QD started.  Patient called in again not feeling well and stating BP low and not feeling well.   Given his symptoms, I also had Dr. Excell Seltzer see the patient. He had been off of his lisinopril for one day and actually  felt better. We discussed proceeding with continued observation versus relook cardiac catheterization. We ultimately decided to continue with observation off of lisinopril.  He continues to have episodes of chest discomfort as well as lightheadedness and weakness. He also feels a hard heartbeat from time to time. The lightheadedness and feelings of irregular heartbeats are what bothers him the most. His chest discomfort is unchanged since before his heart catheterization. He denies exertional CP.  He denies syncope or near-syncope. He has not tried nitroglycerin. He denies orthopnea, PND or edema. He denies dyspnea with exertion.  Labs (4/14): K 5.4, Cr 1.1, ALT 23, Hgb 14.8, LDL 91, TSH 4.61  Labs (5/14): K 4, Cr 1.1, ALT 20, LDL 56, Hgb 14.5  Labs (7/14): K 4.3, Cr 0.97, Hgb 13.6, PLT 111K,   Wt Readings from Last 3 Encounters:  09/06/12 185 lb (83.915 kg)  08/30/12 183 lb 6.8 oz (83.2 kg)  08/29/12 186 lb (84.369 kg)     Past Medical History  Diagnosis Date  . Hyperlipidemia   . Hypertension     mild hypertension  . Coronary artery disease     a. s/p CABG 1994;  B. Abnl myoview 08/2008: Anterolateral ischemia, EF 57%;  LHC 7/10: Xience DES x2 to the bifurcation lesion of the circumflex and obtuse marginal. c. Abnl myoview 08/2012-> cath with interval occ of SVG-RCA, CTO of LAD/RCA, severe prox LCx stenosis s/p FFR-guided DES to LCx  . Thrombocytopenia  a. Noted on labs 08/2012.    Current Outpatient Prescriptions  Medication Sig Dispense Refill  . aspirin 325 MG tablet Take 325 mg by mouth daily.      . clopidogrel (PLAVIX) 75 MG tablet TAKE ONE TABLET BY MOUTH EVERY DAY  30 tablet  6  . isosorbide mononitrate (IMDUR) 15 mg TB24 Take 0.5 tablets (15 mg total) by mouth daily.  15 tablet  6  . metoprolol tartrate (LOPRESSOR) 25 MG tablet Take 0.5 tablets (12.5 mg total) by mouth 2 (two) times daily.  60 tablet  6  . nitroGLYCERIN (NITROSTAT) 0.4 MG SL tablet Place 1 tablet (0.4 mg  total) under the tongue every 5 (five) minutes as needed for chest pain.  25 tablet  6  . rosuvastatin (CRESTOR) 20 MG tablet Take 20 mg by mouth daily.       No current facility-administered medications for this visit.    Allergies:    Allergies  Allergen Reactions  . Lipitor (Atorvastatin) Itching and Rash    Social History:  The patient  reports that he quit smoking about 49 years ago. He does not have any smokeless tobacco history on file.   ROS:  Please see the history of present illness.      All other systems reviewed and negative.   PHYSICAL EXAM: VS:  BP 132/69  Pulse 42  Ht 6\' 1"  (1.854 m)  Wt 188 lb (85.276 kg)  BMI 24.81 kg/m2 Well nourished, well developed, in no acute distress HEENT: normal Neck: no JVD Cardiac:  normal S1, S2; RRR; no murmur Lungs:  clear to auscultation bilaterally, no wheezing, rhonchi or rales Abd: soft, nontender, no hepatomegaly Ext: no edema; right groin without hematoma or bruit  Skin: warm and dry Neuro:  CNs 2-12 intact, no focal abnormalities noted  EKG:  Sinus bradycardia, HR 42, normal axis, no ST changes     ASSESSMENT AND PLAN:  1. Dizziness: I suspect all the symptoms may be symptomatic bradycardia.  I had a long discussion with him regarding proceeding with cardiac catheterization as was previously discussed versus discontinuing his metoprolol and setting him up with an event monitor. After much discussion, we have decided to proceed with discontinuing his metoprolol and placing him on an event monitor. Should his symptoms worsen between now and follow up, he should call for an appointment. 2. Chest Pain:  Atypical.  If this continues after the above adjustments, will likely need to pursue relook LHC.   3. CAD:  Continue ASA, Plavix and statin.   4. Hypertension: controlled. 5. Hyperlipidemia:  Continue statin.   6. Disposition:   F/u with Dr. Delane Ginger or me in 4 weeks.   Signed, Tereso Newcomer, PA-C  09/18/2012 8:51 AM

## 2012-09-19 ENCOUNTER — Encounter: Payer: Self-pay | Admitting: *Deleted

## 2012-09-19 ENCOUNTER — Encounter (INDEPENDENT_AMBULATORY_CARE_PROVIDER_SITE_OTHER): Payer: Medicare Other

## 2012-09-19 DIAGNOSIS — R42 Dizziness and giddiness: Secondary | ICD-10-CM

## 2012-09-19 NOTE — Progress Notes (Signed)
Patient ID: Brett Chambers, male   DOB: 06-Apr-1940, 72 y.o.   MRN: [id number] E-Cardio Bramer 30 day cardiac event monitor applied to patient.

## 2012-10-17 ENCOUNTER — Ambulatory Visit (INDEPENDENT_AMBULATORY_CARE_PROVIDER_SITE_OTHER): Payer: Medicare Other | Admitting: Physician Assistant

## 2012-10-17 ENCOUNTER — Encounter: Payer: Self-pay | Admitting: Physician Assistant

## 2012-10-17 VITALS — BP 157/83 | HR 56 | Ht 72.0 in | Wt 190.0 lb

## 2012-10-17 DIAGNOSIS — R42 Dizziness and giddiness: Secondary | ICD-10-CM

## 2012-10-17 DIAGNOSIS — E785 Hyperlipidemia, unspecified: Secondary | ICD-10-CM

## 2012-10-17 DIAGNOSIS — I2510 Atherosclerotic heart disease of native coronary artery without angina pectoris: Secondary | ICD-10-CM

## 2012-10-17 DIAGNOSIS — R079 Chest pain, unspecified: Secondary | ICD-10-CM

## 2012-10-17 DIAGNOSIS — I10 Essential (primary) hypertension: Secondary | ICD-10-CM

## 2012-10-17 NOTE — Patient Instructions (Addendum)
NO CHANGES WERE MADE TODAY  PLEASE FOLLOW UP WITH SCOTT WEAVER, [facility] 01/21/13 8:30 AM

## 2012-10-17 NOTE — Progress Notes (Signed)
[address] [place], [place]  [address] Phone: [phone] Fax:  [phone]  Date:  10/17/2012   ID:  Brett Chambers, DOB Jun 08, 1940, MRN [id number]  PCP:  Brett Baars, MD  Cardiologist:  Dr. Delane Chambers     History of Present Illness: Brett Chambers is a 71 y.o. male who returns for f/u.  He has a hx of CAD, s/p CABG in 1994, HTN, HL. Echo 9/9: EF 55-60%, mild MR, mild TR, mild AI, mild Ao sclerosis. Myoview 08/2008: Anterolateral ischemia, EF 57%. LHC 08/2008: LAD occluded, RCA occluded, circumflex 60-70%, OM 95%, SVG-OM occluded, SVG-diagonal occluded, SVG-RCA patent with 40-50% proximal and 40-50% distal, RIMA-LAD patent, EF 60%. PCI: Xience DES x2 to the bifurcation lesion of the CFX and OM. Relook catheterization 09/2008: Stable anatomy, patent circumflex stents.   Patient was seen in the office recently with chest pain and stress testing was high risk with anterolateral ischemia.  LHC 08/29/12: LM and oCFX junction 75%, ostial LAD occluded, oCFX 75-80%, CFX stents patent with 50% ISR at prox edge, 40% before stented segment in mid CFX, pRCA occluded, S-OM occluded, S-Dx occluded, S-RCA occluded, RIMA-LAD patent, EF 55%. It was noted that the patient had an interval occlusion of the S-RCA since his last heart cath in 2010 and severe proximal CFX stenosis. Patient underwent FFR guided PCI with placement of a Xience DES to the ostial CFX.  Of note, it was felt that PCI of the RCA would involve an extensive CTO of very long length and would be technically challenging.  Post PCI course was uneventful.  DAPT recommended x 1 year.  Patient called in with recent CP since PCI.  Imdur 15 mg QD was started.  He called in again not feeling well and stating BP low.   Given his symptoms, I also had Dr. Excell Chambers see the patient.  We discussed proceeding with continued observation versus relook cardiac catheterization. We ultimately decided to continue with observation.  I saw him 09/18/12.   He continued to note episodes of CP and lightheadedness.  We opted to stop his beta blocker and place an event monitor.  I have reviewed the available strips from the event monitor today and it demonstrates NSR, sinus brady, very frequent PACs and occasional atrial runs (3-4 beats).  No significant pauses.  He is felling better.  No exertional CP or dyspnea. He has occasional brief atypical sharp CP.  No further dizziness.  He notes that he feels better if he remains active.  He has been somewhat hesitant to increase activity since his PCI.  Denies orthopnea, PND, edema.  Labs (4/14): K 5.4, Cr 1.1, ALT 23, Hgb 14.8, LDL 91, TSH 4.61  Labs (5/14): K 4, Cr 1.1, ALT 20, LDL 56, Hgb 14.5  Labs (7/14): K 4.3, Cr 0.97, Hgb 13.6, PLT 111K,   Wt Readings from Last 3 Encounters:  10/17/12 190 lb (86.183 kg)  09/18/12 188 lb (85.276 kg)  09/06/12 185 lb (83.915 kg)     Past Medical History  Diagnosis Date  . Hyperlipidemia   . Hypertension     mild hypertension  . Coronary artery disease     a. s/p CABG 1994;  B. Abnl myoview 08/2008: Anterolateral ischemia, EF 57%;  LHC 7/10: Xience DES x2 to the bifurcation lesion of the circumflex and obtuse marginal. c. Abnl myoview 08/2012-> cath with interval occ of SVG-RCA, CTO of LAD/RCA, severe prox LCx stenosis s/p FFR-guided DES to LCx  .  Thrombocytopenia     a. Noted on labs 08/2012.    Current Outpatient Prescriptions  Medication Sig Dispense Refill  . aspirin 325 MG tablet Take 325 mg by mouth daily.      . clopidogrel (PLAVIX) 75 MG tablet TAKE ONE TABLET BY MOUTH EVERY DAY  30 tablet  6  . isosorbide mononitrate (IMDUR) 15 mg TB24 Take 0.5 tablets (15 mg total) by mouth daily.  15 tablet  6  . nitroGLYCERIN (NITROSTAT) 0.4 MG SL tablet Place 1 tablet (0.4 mg total) under the tongue every 5 (five) minutes as needed for chest pain.  25 tablet  6  . rosuvastatin (CRESTOR) 20 MG tablet Take 20 mg by mouth daily.       No current facility-administered  medications for this visit.    Allergies:    Allergies  Allergen Reactions  . Lipitor [Atorvastatin] Itching and Rash    Social History:  The patient  reports that he quit smoking about 49 years ago. He does not have any smokeless tobacco history on file.   ROS:  Please see the history of present illness.    All other systems reviewed and negative.   PHYSICAL EXAM: VS:  BP 157/83  Pulse 56  Ht 6' (1.829 m)  Wt 190 lb (86.183 kg)  BMI 25.76 kg/m2 Well nourished, well developed, in no acute distress HEENT: normal Neck: no JVD Cardiac:  normal S1, S2; RRR; no murmur Lungs:  clear to auscultation bilaterally, no wheezing, rhonchi or rales Abd: soft, nontender, no hepatomegaly Ext: no edema; right groin without hematoma or bruit  Skin: warm and dry Neuro:  CNs 2-12 intact, no focal abnormalities noted  EKG:  NSR, HR 56, normal axis, no acute changes  ASSESSMENT AND PLAN:  1. Dizziness:  Resolved.  Event monitor without significant findings.  I suspect he was symptomatic from bradycardia brought on by the beta blocker.   2. Chest Pain:  Atypical.  No symptoms suggestive of angina.  No further workup.     3. CAD:  Continue ASA, Plavix and statin.   4. Hypertension:  BP elevated.  Readings at hom e have been optimal.  He will keep an eye on his BP and call if it remains high.  5. Hyperlipidemia:  Continue statin.   6. Disposition:   F/u with me in 3 mos.   Signed, Brett Newcomer, PA-C  10/17/2012 9:21 AM

## 2012-10-18 NOTE — Addendum Note (Signed)
Addended by: Reesa Chew on: 10/18/2012 08:12 AM   Modules accepted: Orders

## 2012-10-28 ENCOUNTER — Telehealth: Payer: Self-pay | Admitting: *Deleted

## 2012-10-28 NOTE — Telephone Encounter (Signed)
ecardio monitor/ NSR, PAC's

## 2012-12-19 ENCOUNTER — Other Ambulatory Visit: Payer: Self-pay

## 2013-01-21 ENCOUNTER — Ambulatory Visit (INDEPENDENT_AMBULATORY_CARE_PROVIDER_SITE_OTHER): Payer: Medicare Other | Admitting: Physician Assistant

## 2013-01-21 ENCOUNTER — Encounter: Payer: Self-pay | Admitting: Physician Assistant

## 2013-01-21 VITALS — BP 144/78 | HR 59 | Ht 72.0 in | Wt 189.8 lb

## 2013-01-21 DIAGNOSIS — I10 Essential (primary) hypertension: Secondary | ICD-10-CM

## 2013-01-21 DIAGNOSIS — I2510 Atherosclerotic heart disease of native coronary artery without angina pectoris: Secondary | ICD-10-CM

## 2013-01-21 DIAGNOSIS — E785 Hyperlipidemia, unspecified: Secondary | ICD-10-CM

## 2013-01-21 MED ORDER — LISINOPRIL 5 MG PO TABS: 2.5000 mg | ORAL_TABLET | Freq: Every day | ORAL | Status: DC

## 2013-01-21 MED ORDER — ROSUVASTATIN CALCIUM 20 MG PO TABS: 20.0000 mg | ORAL_TABLET | Freq: Every day | ORAL | Status: DC

## 2013-01-21 NOTE — Patient Instructions (Addendum)
START LISINOPRIL 5 MG TABLET; TAKE 1/2 TABLET (2.5 MG ) DAILY  A REFILL FOR CRESTOR HAS BEEN SENT IN AS WELL  LAB WORK 02/03/13 ANYTIME BETWEEN 7:30-4:30 (BMET)  Your physician wants you to follow-up in: 6 MONTHS WITH DR. Elease Hashimoto. You will receive a reminder letter in the mail two months in advance. If you don't receive a letter, please call our office to schedule the follow-up appointment.

## 2013-01-21 NOTE — Progress Notes (Signed)
[address] [place], [place]  [address] Phone: [phone] Fax:  [phone]  Date:  01/21/2013   ID:  AIDENJAMES Chambers, DOB 12-Sep-1940, MRN [id number]  PCP:  Kari Baars, MD  Cardiologist:  Dr. Delane Ginger     History of Present Illness: Brett Chambers is a 72 y.o. male with a hx of CAD, s/p CABG in 1994, HTN, HL. Echo 9/9: EF 55-60%, mild MR, mild TR, mild AI, mild Ao sclerosis. Myoview in 08/2008 with anterolateral ischemia, EF 57%. LHC in 08/2008 demonstrated 2/4 grafts patent with high grade disease at the bifurcation of the CFX and OM.  He underwent PC with a Xience DES x2.  Relook catheterization 09/2008: Stable anatomy, patent circumflex stents.   Patient was seen in the office earlier this year with chest pain and stress testing was high risk with anterolateral ischemia.  LHC 08/29/12: LM and oCFX junction 75%, ostial LAD occluded, oCFX 75-80%, CFX stents patent with 50% ISR at prox edge, 40% before stented segment in mid CFX, pRCA occluded, S-OM occluded, S-Dx occluded, S-RCA occluded, RIMA-LAD patent, EF 55%. It was noted that the patient had an interval occlusion of the S-RCA since his last heart cath in 2010 and severe proximal CFX stenosis. Patient underwent FFR guided PCI with placement of a Xience DES to the ostial CFX.  Of note, it was felt that PCI of the RCA would involve an extensive CTO of very long length and would be technically challenging.  Post PCI course was uneventful.  DAPT recommended x 1 year.  I saw him in 8/14 and he continued to note episodes of CP and lightheadedness.  We opted to stop his beta blocker and placed an event monitor.  This demonstrated NSR, sinus brady, very frequent PACs and occasional atrial runs (3-4 beats).  No significant pauses.    The patient denies, shortness of breath, syncope, orthopnea, PND or significant pedal edema.   He has occasional atypical chest pain. He brings a list of his blood pressures for my review. A lot of his  blood pressures are taken before he takes his isosorbide in the morning. Some of these are in the 140-150 range. His highest blood pressure was 160/90. We discussed the recent findings of JNC 8.     Recent Labs: 08/30/2012: Creatinine 0.97; Potassium 4.3 07/09/2012: ALT 20; HDL 39.70; LDL (calc) 56 08/30/2012: Hemoglobin 13.6 No results found for requested labs within last 365 days.   Wt Readings from Last 3 Encounters:  01/21/13 189 lb 12.8 oz (86.093 kg)  10/17/12 190 lb (86.183 kg)  09/18/12 188 lb (85.276 kg)     Past Medical History  Diagnosis Date  . Hyperlipidemia   . Hypertension     mild hypertension  . Coronary artery disease     a. s/p CABG 1994;  B. Abnl myoview 08/2008: Anterolateral ischemia, EF 57%;  LHC 7/10: Xience DES x2 to the bifurcation lesion of the circumflex and obtuse marginal. c. Abnl myoview 08/2012-> cath with interval occ of SVG-RCA, CTO of LAD/RCA, severe prox LCx stenosis s/p FFR-guided DES to LCx  . Thrombocytopenia     a. Noted on labs 08/2012.  . Bradycardia     a. event monitor (8/14):  NSR, sinus brady, very frequent PACs and occasional atrial runs (3-4 beats).  No significant pauses.    Current Outpatient Prescriptions  Medication Sig Dispense Refill  . aspirin 325 MG tablet Take 325 mg by mouth daily.      Marland Kitchen  clopidogrel (PLAVIX) 75 MG tablet TAKE ONE TABLET BY MOUTH EVERY DAY  30 tablet  6  . FLUZONE HIGH-DOSE injection       . isosorbide mononitrate (IMDUR) 15 mg TB24 Take 0.5 tablets (15 mg total) by mouth daily.  15 tablet  6  . nitroGLYCERIN (NITROSTAT) 0.4 MG SL tablet Place 1 tablet (0.4 mg total) under the tongue every 5 (five) minutes as needed for chest pain.  25 tablet  6  . rosuvastatin (CRESTOR) 20 MG tablet Take 20 mg by mouth daily.       No current facility-administered medications for this visit.    Allergies:    Allergies  Allergen Reactions  . Lipitor [Atorvastatin] Itching and Rash    Social History:  The patient   reports that he quit smoking about 49 years ago. He does not have any smokeless tobacco history on file. He reports that he does not use illicit drugs.   ROS:  Please see the history of present illness.    All other systems reviewed and negative.   PHYSICAL EXAM: VS:  BP 144/78  Pulse 59  Ht 6' (1.829 m)  Wt 189 lb 12.8 oz (86.093 kg)  BMI 25.74 kg/m2 Well nourished, well developed, in no acute distress HEENT: normal Neck: no JVD Cardiac:  normal S1, S2; RRR; no murmur Lungs:  clear to auscultation bilaterally, no wheezing, rhonchi or rales Abd: soft, nontender, no hepatomegaly Ext: no edema; right groin without hematoma or bruit  Skin: warm and dry Neuro:  CNs 2-12 intact, no focal abnormalities noted  EKG:  Sinus bradycardia, HR 59, normal axis, PACs, no change from prior tracing  ASSESSMENT AND PLAN:  1. CAD:  No angina.  Continue ASA, Plavix and statin.   2. Hypertension:  We had a long discussion regarding current guidelines according to JNC 8.  His current BP (from readings at home) is somewhat borderline.  He is not comfortable leaving his BP at the current level.  After much discussion (> 20 minutes spent counseling), I have decided to place him back on Lisinopril 2.5 mg QD.  Check BMET in 1 week.  He will call if his BP continues to run too high.  At that point, I would adjust his Lisinopril to 5 mg QD. 3. Hyperlipidemia:  Continue statin.   4. Disposition:   F/u with Dr. Delane Ginger in 6 mos.   Signed, Tereso Newcomer, PA-C  01/21/2013 8:42 AM

## 2013-02-03 ENCOUNTER — Other Ambulatory Visit: Payer: Medicare Other

## 2013-02-04 ENCOUNTER — Other Ambulatory Visit (INDEPENDENT_AMBULATORY_CARE_PROVIDER_SITE_OTHER): Payer: Medicare Other

## 2013-02-04 DIAGNOSIS — I10 Essential (primary) hypertension: Secondary | ICD-10-CM

## 2013-02-04 LAB — BASIC METABOLIC PANEL
BUN: 12 mg/dL (ref 6–23)
CO2: 28 mEq/L (ref 19–32)
Calcium: 9.2 mg/dL (ref 8.4–10.5)
Creatinine, Ser: 1 mg/dL (ref 0.4–1.5)
GFR: 74.46 mL/min (ref >60.00)
Glucose, Bld: 88 mg/dL (ref 70–99)
Sodium: 139 mEq/L (ref 135–145)

## 2013-03-20 ENCOUNTER — Other Ambulatory Visit: Payer: Self-pay | Admitting: Cardiovascular Disease

## 2013-04-30 ENCOUNTER — Other Ambulatory Visit: Payer: Self-pay | Admitting: Cardiovascular Disease

## 2013-05-29 ENCOUNTER — Other Ambulatory Visit: Payer: Self-pay

## 2013-05-29 MED ORDER — ISOSORBIDE MONONITRATE ER 30 MG PO TB24: ORAL_TABLET | ORAL | Status: DC

## 2013-06-11 ENCOUNTER — Encounter: Payer: Self-pay | Admitting: Cardiovascular Disease

## 2013-06-24 ENCOUNTER — Other Ambulatory Visit: Payer: Self-pay

## 2013-06-24 MED ORDER — ISOSORBIDE MONONITRATE ER 30 MG PO TB24: ORAL_TABLET | ORAL | Status: DC

## 2013-06-24 MED ORDER — CLOPIDOGREL BISULFATE 75 MG PO TABS: ORAL_TABLET | ORAL | Status: DC

## 2013-07-01 ENCOUNTER — Encounter: Payer: Self-pay | Admitting: Cardiovascular Disease

## 2013-07-02 ENCOUNTER — Other Ambulatory Visit: Payer: Self-pay | Admitting: Cardiovascular Disease

## 2013-07-15 IMAGING — CR DG CHEST 2V
2 series · 2 of 2 positions shown · non-contrast
Comparison: None.

CLINICAL DATA: Left-sided chest pain, former smoking history

CHEST - 2 VIEW

[view not recorded (1 of 2)]
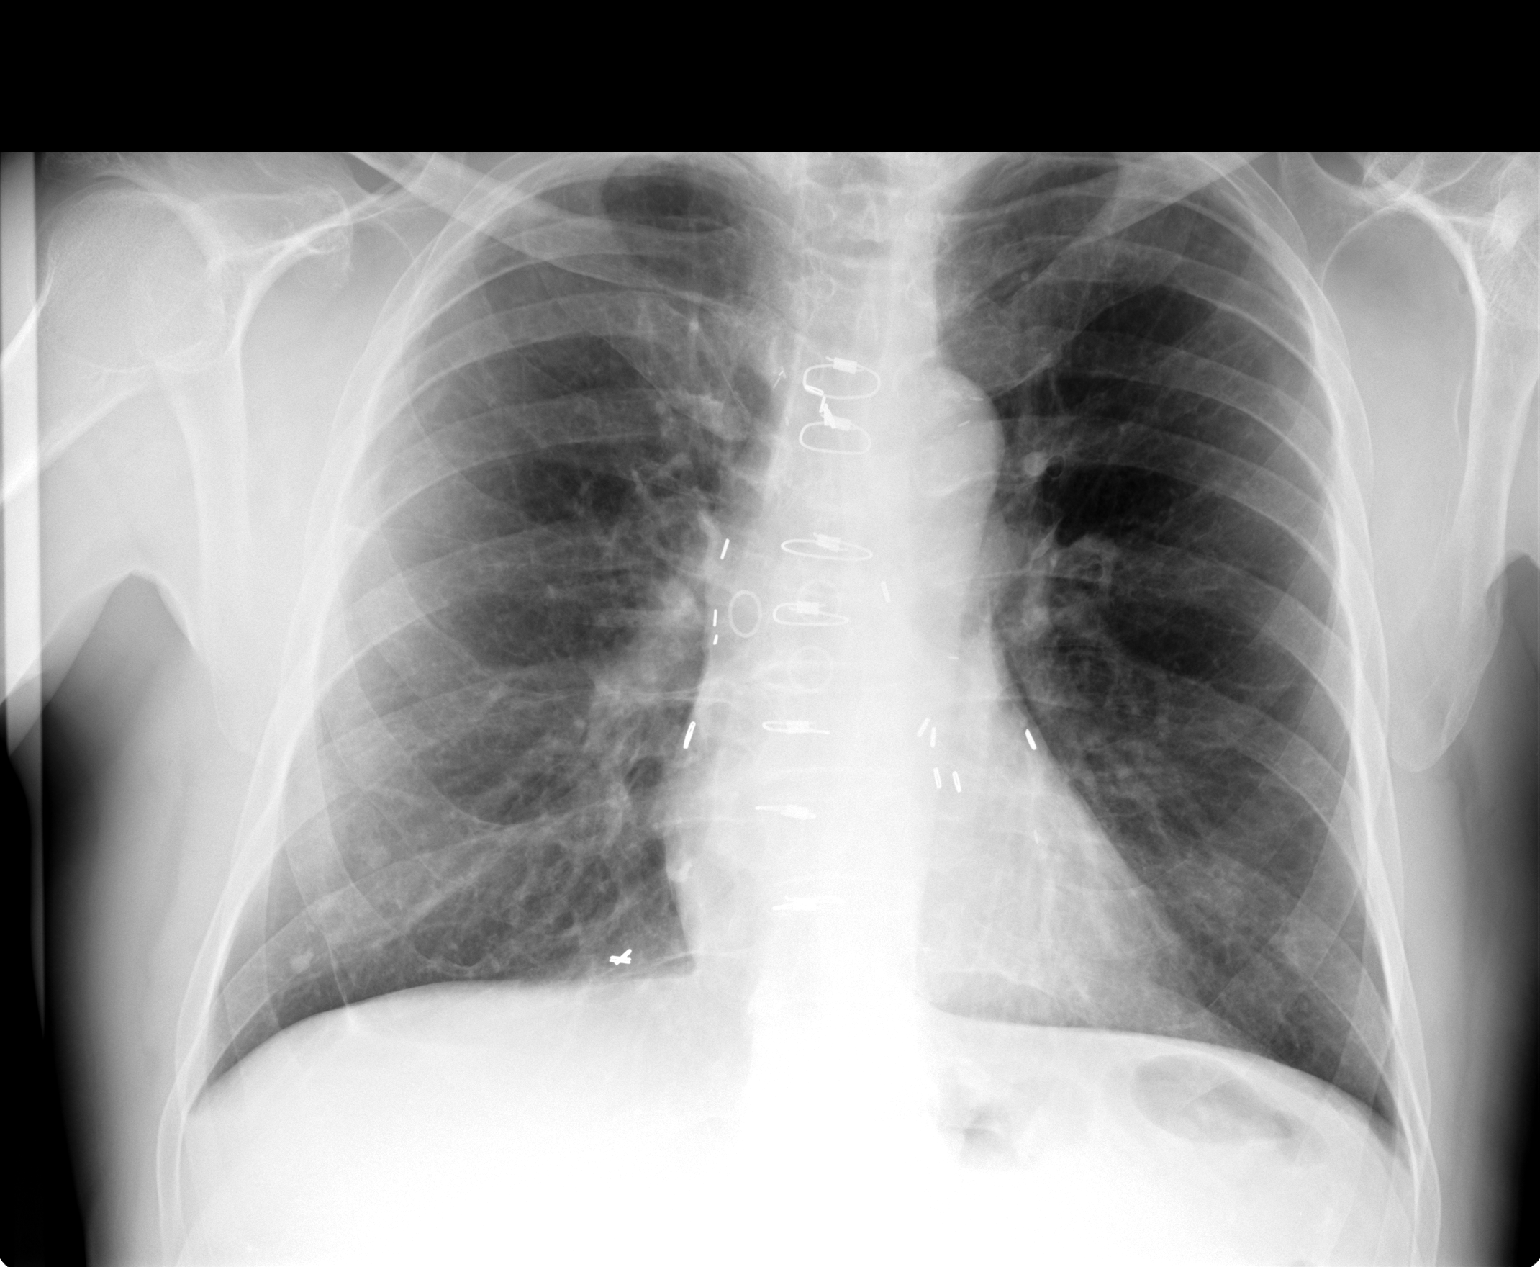

[view not recorded (2 of 2)]
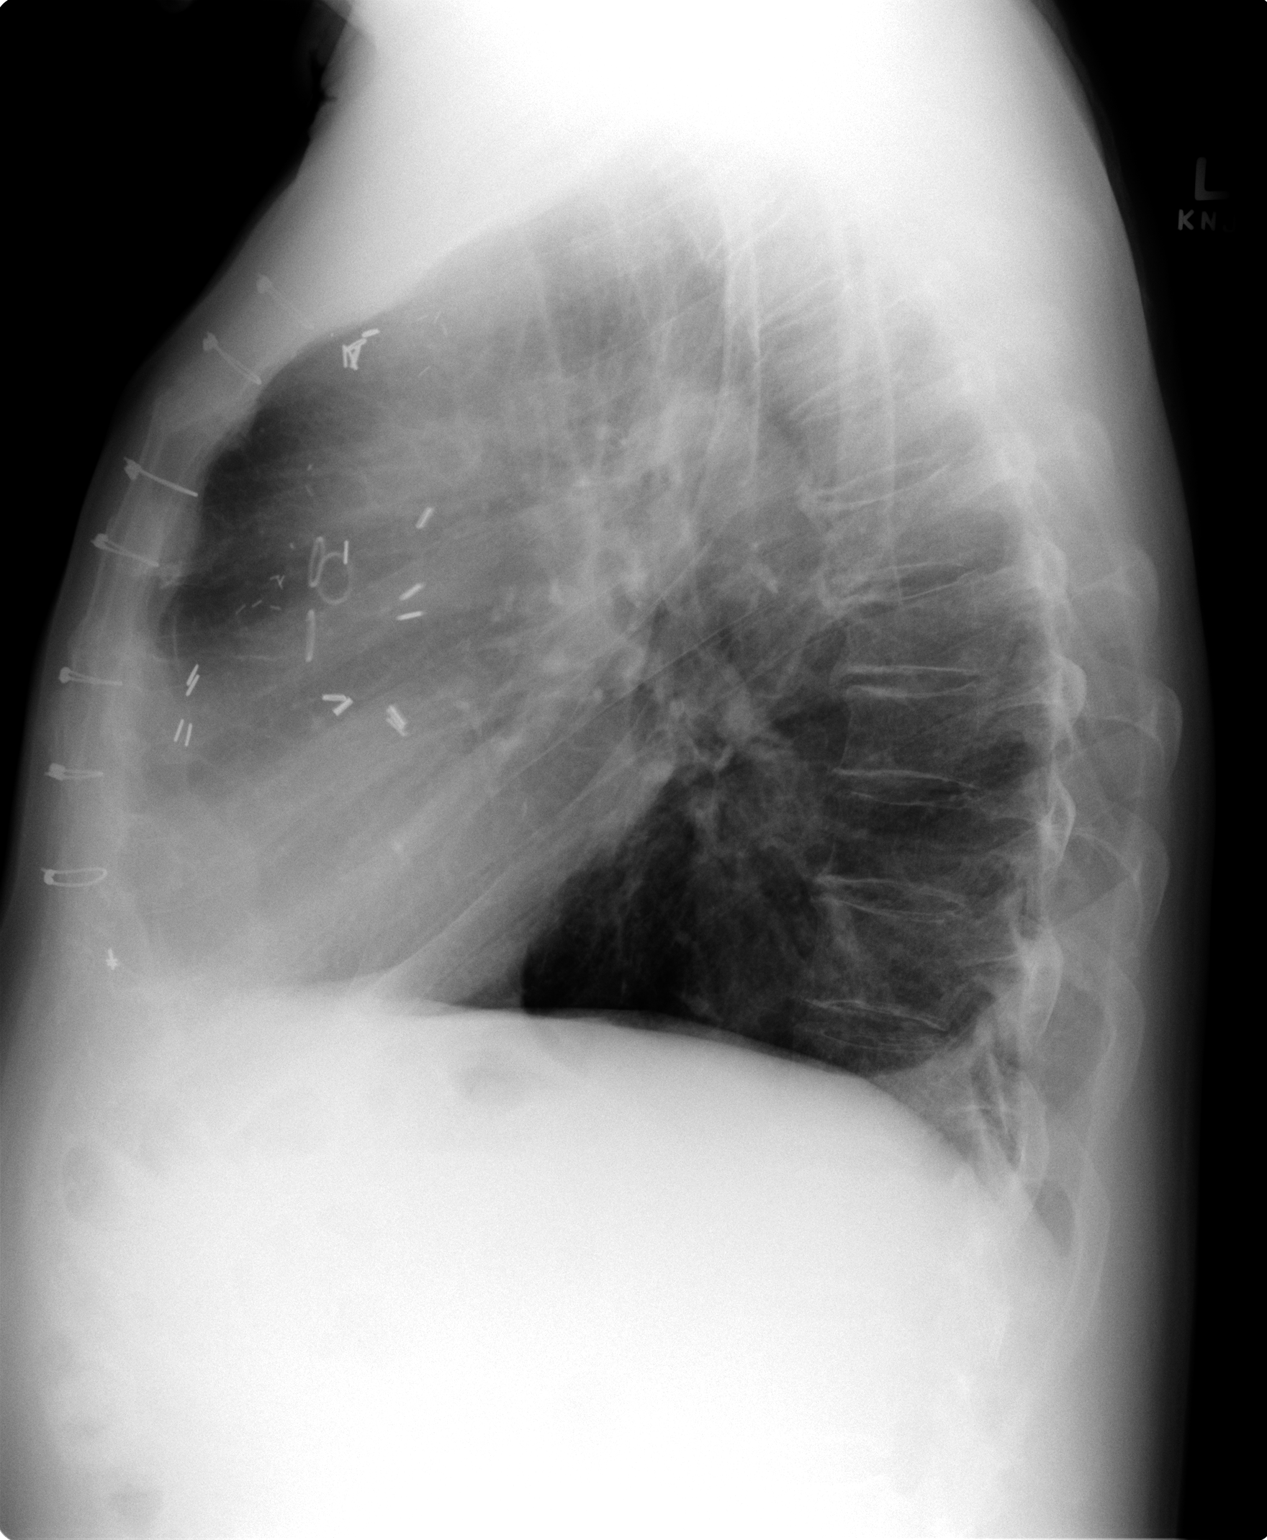

[2 of 2 positions shown; findings below may reference images not displayed]

FINDINGS: The lungs are hyperaerated with slightly flattened
hemidiaphragms consistent with emphysema.  No effusion is seen.
Mediastinal contours are normal.  The heart is within normal limits
in size.  There are mediastinal sutures present from prior CABG.
IMPRESSION: Emphysema.  No active lung disease.

## 2013-07-18 ENCOUNTER — Other Ambulatory Visit: Payer: Self-pay | Admitting: Cardiovascular Disease

## 2013-07-24 ENCOUNTER — Encounter: Payer: Self-pay | Admitting: Cardiovascular Disease

## 2013-07-24 ENCOUNTER — Ambulatory Visit (INDEPENDENT_AMBULATORY_CARE_PROVIDER_SITE_OTHER): Payer: Medicare HMO | Admitting: Cardiovascular Disease

## 2013-07-24 VITALS — BP 160/82 | HR 62 | Ht 73.0 in | Wt 192.0 lb

## 2013-07-24 DIAGNOSIS — I2510 Atherosclerotic heart disease of native coronary artery without angina pectoris: Secondary | ICD-10-CM

## 2013-07-24 DIAGNOSIS — E7800 Pure hypercholesterolemia, unspecified: Secondary | ICD-10-CM

## 2013-07-24 DIAGNOSIS — E785 Hyperlipidemia, unspecified: Secondary | ICD-10-CM

## 2013-07-24 DIAGNOSIS — I10 Essential (primary) hypertension: Secondary | ICD-10-CM

## 2013-07-24 MED ORDER — ROSUVASTATIN CALCIUM 20 MG PO TABS: 20.0000 mg | ORAL_TABLET | Freq: Every day | ORAL | Status: DC

## 2013-07-24 MED ORDER — ASPIRIN EC 81 MG PO TBEC: 81.0000 mg | DELAYED_RELEASE_TABLET | Freq: Every day | ORAL | Status: DC

## 2013-07-24 MED ORDER — ISOSORBIDE MONONITRATE ER 30 MG PO TB24: ORAL_TABLET | ORAL | Status: DC

## 2013-07-24 MED ORDER — LISINOPRIL 5 MG PO TABS: 2.5000 mg | ORAL_TABLET | Freq: Every day | ORAL | Status: DC

## 2013-07-24 MED ORDER — CLOPIDOGREL BISULFATE 75 MG PO TABS: ORAL_TABLET | ORAL | Status: DC

## 2013-07-24 MED ORDER — NITROGLYCERIN 0.4 MG SL SUBL: 0.4000 mg | SUBLINGUAL_TABLET | SUBLINGUAL | Status: DC | PRN

## 2013-07-24 NOTE — Patient Instructions (Signed)
Your physician has recommended you make the following change in your medication:  1) REDUCE Aspirin 81 mg daily  Take all other medications as prescribed. Your medications have been refilled today  Your physician wants you to follow-up in: 1 year You will receive a reminder letter in the mail two months in advance. If you don't receive a letter, please call our office to schedule the follow-up appointment.

## 2013-07-24 NOTE — Assessment & Plan Note (Signed)
Agamjot is doing well. [name] not having any angina. He has some vague musculoskeletal pain that is clearly related to muscle skeletal etiology. We will reduce his aspirin to 81 mg a day. His lipid profile was performed at his primary medical doctors and is LDL is 65.  We will continue with the same medications.

## 2013-07-24 NOTE — Assessment & Plan Note (Signed)
His LDL is 65. His lipids have been managed by his primary medical doctor.

## 2013-07-24 NOTE — Progress Notes (Signed)
[facility], [address], [place]  [address] Phone: [phone] Fax:  [phone]  Date:  07/24/2013   ID:  Brett Chambers, DOB 1940/08/15, MRN [id number]  PCP:  [name], MD  Cardiologist:  Dr. Liam Rogers    Problem list  1. Coronary artery disease-status post CABG (1994) and PTCA and stenting of LCx / OM in Aug 2010.  2. Hypertension  3. Hyperlipidemia    History of Present Illness: Brett Chambers is a 73 y.o. male with a hx of CAD, s/p CABG in 1994, HTN, HL. Echo 9/9: EF 55-60%, mild MR, mild TR, mild AI, mild Ao sclerosis. Myoview in 08/2008 with anterolateral ischemia, EF 57%. LHC in 08/2008 demonstrated 2/4 grafts patent with high grade disease at the bifurcation of the CFX and OM.  He underwent PC with a Xience DES x2.  Relook catheterization 09/2008: Stable anatomy, patent circumflex stents.   Patient was seen in the office earlier this year with chest pain and stress testing was high risk with anterolateral ischemia.  LHC 08/29/12: LM and oCFX junction 75%, ostial LAD occluded, oCFX 75-80%, CFX stents patent with 50% ISR at prox edge, 40% before stented segment in mid CFX, pRCA occluded, S-OM occluded, S-Dx occluded, S-RCA occluded, RIMA-LAD patent, EF 55%. It was noted that the patient had an interval occlusion of the S-RCA since his last heart cath in 2010 and severe proximal CFX stenosis. Patient underwent FFR guided PCI with placement of a Xience DES to the ostial CFX.  Of note, it was felt that PCI of the RCA would involve an extensive CTO of very long length and would be technically challenging.  Post PCI course was uneventful.  DAPT recommended x 1 year.  I saw him in 8/14 and he continued to note episodes of CP and lightheadedness.  We opted to stop his beta blocker and placed an event monitor.  This demonstrated NSR, sinus brady, very frequent PACs and occasional atrial runs (3-4 beats).  No significant pauses.    The patient denies, shortness of breath,  syncope, orthopnea, PND or significant pedal edema.   He has occasional atypical chest pain. He brings a list of his blood pressures for my review. A lot of his blood pressures are taken before he takes his isosorbide in the morning. Some of these are in the 140-150 range. His highest blood pressure was 160/90. We discussed the recent findings of JNC 8.    July 24, 2013:  BP readings are ok at home.  BP is typically elevated.  Has some fatigue.  Has some chest discomfort on the left side of his chest - related to twisiting and turning his torso - likely musculskelatal.     Recent Labs: 02/04/2013: Creatinine 1.0; Potassium 4.3 No results found for requested labs within last 365 days.08/30/2012: Hemoglobin 13.6 No results found for requested labs within last 365 days.   Wt Readings from Last 3 Encounters:  07/24/13 192 lb (87.091 kg)  01/21/13 189 lb 12.8 oz (86.093 kg)  10/17/12 190 lb (86.183 kg)     Past Medical History  Diagnosis Date  . Hyperlipidemia   . Hypertension     mild hypertension  . Coronary artery disease     a. s/p CABG 1994;  B. Abnl myoview 08/2008: Anterolateral ischemia, EF 57%;  LHC 7/10: Xience DES x2 to the bifurcation lesion of the circumflex and obtuse marginal. c. Abnl myoview 08/2012-> cath with interval occ of SVG-RCA, CTO of  LAD/RCA, severe prox LCx stenosis s/p FFR-guided DES to LCx  . Thrombocytopenia     a. Noted on labs 08/2012.  . Bradycardia     a. event monitor (8/14):  NSR, sinus brady, very frequent PACs and occasional atrial runs (3-4 beats).  No significant pauses.    Current Outpatient Prescriptions  Medication Sig Dispense Refill  . aspirin 325 MG tablet Take 325 mg by mouth daily.      . clopidogrel (PLAVIX) 75 MG tablet TAKE ONE TABLET BY MOUTH ONCE DAILY  30 tablet  0  . isosorbide mononitrate (IMDUR) 30 MG 24 hr tablet TAKE ONE-HALF TABLET BY MOUTH ONCE DAILY  15 tablet  0  . lisinopril (PRINIVIL,ZESTRIL) 5 MG tablet Take 0.5 tablets  (2.5 mg total) by mouth daily.  45 tablet  3  . nitroGLYCERIN (NITROSTAT) 0.4 MG SL tablet Place 1 tablet (0.4 mg total) under the tongue every 5 (five) minutes as needed for chest pain.  25 tablet  6  . rosuvastatin (CRESTOR) 20 MG tablet Take 1 tablet (20 mg total) by mouth daily.  90 tablet  3   No current facility-administered medications for this visit.    Allergies:    Allergies  Allergen Reactions  . Lipitor [Atorvastatin] Itching and Rash    Social History:  The patient  reports that he quit smoking about 50 years ago. He does not have any smokeless tobacco history on file. He reports that he does not use illicit drugs.   ROS:  Please see the history of present illness.    All other systems reviewed and negative.   PHYSICAL EXAM: VS:  BP 160/82  Pulse 62  Ht 6\' 1"  (1.854 m)  Wt 192 lb (87.091 kg)  BMI 25.34 kg/m2 Well nourished, well developed, in no acute distress HEENT: normal Neck: no JVD Cardiac:  normal S1, S2; RRR; no murmur Lungs:  clear to auscultation bilaterally, no wheezing, rhonchi or rales Abd: soft, nontender, no hepatomegaly Ext: no edema; right groin without hematoma or bruit  Skin: warm and dry Neuro:  CNs 2-12 intact, no focal abnormalities noted  EKG:  Sinus bradycardia, HR 59, normal axis, PACs, no change from prior tracing  ASSESSMENT AND PLAN:  1. CAD:  No angina.  Continue ASA, Plavix and statin.   2. Hypertension:  We had a long discussion regarding current guidelines according to [place] 8.  His current BP (from readings at home) is somewhat borderline.  He is not comfortable leaving his BP at the current level.  After much discussion (> 20 minutes spent counseling), I have decided to place him back on Lisinopril 2.5 mg QD.  Check BMET in 1 week.  He will call if his BP continues to run too high.  At that point, I would adjust his Lisinopril to 5 mg QD. 3. Hyperlipidemia:  Continue statin.   4. Disposition:   F/u with Dr. Liam Rogers in 6 mos.    Signed, [name], PA-C  07/24/2013 8:27 AM

## 2013-07-24 NOTE — Assessment & Plan Note (Signed)
He has whitecoat hypertension. His blood pressure readings at home are normal. [name] quite active and eats a low salt diet. Continue same medications.

## 2013-10-27 ENCOUNTER — Telehealth: Payer: Self-pay | Admitting: Cardiovascular Disease

## 2013-10-27 NOTE — Telephone Encounter (Signed)
Left message for Herb Grays, RN on her personal voice mail that patient is prescribed Lisinopril 2.5 mg once daily and advised her to call back with questions or concerns.

## 2013-10-27 NOTE — Telephone Encounter (Signed)
New question:  Iver Nestle RN case manager:     Is pt still on  Lisinopril call  Her back if so.  If no reply nessary.   If pt is none compliant a letter will be sent to this office.

## 2014-01-22 ENCOUNTER — Encounter (HOSPITAL_COMMUNITY): Payer: Self-pay | Admitting: Cardiovascular Disease

## 2014-05-16 ENCOUNTER — Ambulatory Visit (HOSPITAL_COMMUNITY)
Admission: AD | Admit: 2014-05-16 | Discharge: 2014-05-16 | Disposition: A | Discharge status: Home / Self Care | Payer: No Typology Code available for payment source | Source: Ambulatory Visit | Attending: Orthopedic Surgery | Admitting: Orthopedic Surgery

## 2014-05-16 ENCOUNTER — Ambulatory Visit (HOSPITAL_COMMUNITY)
Admission: RE | Admit: 2014-05-16 | Discharge: 2014-05-16 | Disposition: A | Discharge status: Home / Self Care | Payer: No Typology Code available for payment source | Source: Ambulatory Visit | Attending: Orthopedic Surgery | Admitting: Orthopedic Surgery

## 2014-05-16 ENCOUNTER — Other Ambulatory Visit: Payer: Self-pay | Admitting: Orthopedic Surgery

## 2014-05-16 DIAGNOSIS — S42022A Displaced fracture of shaft of left clavicle, initial encounter for closed fracture: Secondary | ICD-10-CM | POA: Diagnosis not present

## 2014-05-16 DIAGNOSIS — S2232XA Fracture of one rib, left side, initial encounter for closed fracture: Secondary | ICD-10-CM

## 2014-05-16 DIAGNOSIS — S2242XA Multiple fractures of ribs, left side, initial encounter for closed fracture: Secondary | ICD-10-CM | POA: Diagnosis not present

## 2014-05-16 LAB — CBC
HEMATOCRIT: 39.9 % (ref 39.0–52.0)
HEMOGLOBIN: 13.8 g/dL (ref 13.0–17.0)
MCH: 30.4 pg (ref 26.0–34.0)
MCHC: 34.6 g/dL (ref 30.0–36.0)
MCV: 87.9 fL (ref 78.0–100.0)
PLATELETS: 109 10*3/uL — AB (ref 150–400)
RBC: 4.54 MIL/uL (ref 4.22–5.81)
RDW: 13.9 % (ref 11.5–15.5)
WBC: 14 10*3/uL — AB (ref 4.0–10.5)

## 2014-05-16 LAB — BASIC METABOLIC PANEL
Anion gap: 8 (ref 5–15)
BUN: 20 mg/dL (ref 6–23)
CHLORIDE: 105 mmol/L (ref 96–112)
CO2: 23 mmol/L (ref 19–32)
Calcium: 9 mg/dL (ref 8.4–10.5)
Creatinine, Ser: 1.47 mg/dL — ABNORMAL HIGH (ref 0.50–1.35)
GFR calc Af Amer: 52 mL/min — ABNORMAL LOW (ref >90)
GFR calc non Af Amer: 45 mL/min — ABNORMAL LOW (ref >90)
Glucose, Bld: 144 mg/dL — ABNORMAL HIGH (ref 70–99)
Potassium: 4.3 mmol/L (ref 3.5–5.1)
Sodium: 136 mmol/L (ref 135–145)

## 2014-05-18 ENCOUNTER — Other Ambulatory Visit: Payer: Self-pay | Admitting: Orthopedic Surgery

## 2014-05-20 ENCOUNTER — Encounter (HOSPITAL_COMMUNITY): Payer: Self-pay | Admitting: Vascular Surgery

## 2014-05-20 MED ORDER — POVIDONE-IODINE 7.5 % EX SOLN
Freq: Once | CUTANEOUS | Status: DC
  Filled 2014-05-20: qty 118

## 2014-05-20 MED ORDER — CEFAZOLIN SODIUM-DEXTROSE 2-3 GM-% IV SOLR
2.0000 g | INTRAVENOUS | Status: AC
  Administered 2014-05-21: 2 g via INTRAVENOUS
  Filled 2014-05-20: qty 50

## 2014-05-20 NOTE — Progress Notes (Signed)
Pt denies SOB and chest pain. Pt under the care of Dr. Cathie Olden, cardiology. Pt stated that his last dose of Plavix was taken on Monday ( 05/18/14). Pt denies having an EKG within the last year.

## 2014-05-20 NOTE — Progress Notes (Signed)
Anesthesia Chart Review:  SAME DAY WORK-UP.  Patient is a 74 year old male scheduled for ORIF left clavicular fracture tomorrow by Dr. Tamera Punt. Per Angela Nevin at Dr. Bettina Gavia office, Dr. Tamera Punt called and spoke with patient's cardiologist regarding plans for surgery and was told that Plavix could be held but should be restarted on POD#1.  History includes CAD s/p CABG '94 and PTCA/stenting LCx/OM 09/2008 and DES to ostial CFX 08/2012, HTN, HLD, thrombocytopenia, bradycardia, former smoker. Cardiologist is Dr. [name], last visit 6/11/5. He was "quite active" and "not having any angina" at that time. PCP is Dr. [name].   Meds include ASA, Plavix, Imdur, lisinopril, Nitro, Percocet, Crestor.  Patient had a high risk stress test (inferior wall infarct, severe ischemia in moderate anterolateral sized area from apex to base) on 08/27/12 which lead to a cardiac cath on 08/29/12 that showed: "LM and oCFX junction 75%, ostial LAD occluded, oCFX 75-80%, CFX stents patent with 50% ISR at prox edge, 40% before stented segment in mid CFX, pRCA occluded, S-OM occluded, S-Dx occluded, S-RCA occluded, RIMA-LAD patent, EF 55%. It was noted that the patient had an interval occlusion of the S-RCA since his last heart cath in 2010 and severe proximal CFX stenosis. Patient underwent FFR guided PCI with placement of a Xience DES to the ostial CFX. Of note, it was felt that PCI of the RCA would involve an extensive CTO of very long length and would be technically challenging. Post PCI course was uneventful. DAPT recommended x 1 year."  09/2012 Cardiac event monitor "demonstrated NSR, sinus brady, very frequent PACs and occasional atrial runs (3-4 beats). No significant pauses."   Last echo in 2009 showed NL LVEF, mild MR/AR/TR.  05/16/14 CXR: IMPRESSION: Acute displaced left clavicle fracture Acute left second through fifth rib fractures. No significant pneumothorax or effusion.  Labs from 05/16/14 showed Cr 1.47,  glucose 144, WBC 14.0, H/H 13.8/39.9, PLT count 109K.   His last EKG in Epic is from 01/21/13, so he will need one repeated on the day of surgery. (There is no tracing viewable, but 07/24/13 note from [name] describes an EKG showing "EKG: Sinus bradycardia, HR 59, normal axis, PACs, no change from prior tracing."  Discussed with anesthesiologist Dr. Kalman Shan.  If no acute changes then anticipate that he can proceed as planned.   George Hugh [facility] Short Stay Center/Anesthesiology Phone [phone] 05/20/2014 2:30 PM

## 2014-05-21 ENCOUNTER — Ambulatory Visit (HOSPITAL_COMMUNITY): Payer: Medicare HMO | Admitting: Vascular Surgery

## 2014-05-21 ENCOUNTER — Ambulatory Visit (HOSPITAL_COMMUNITY): Payer: Medicare HMO

## 2014-05-21 ENCOUNTER — Encounter (HOSPITAL_COMMUNITY): Payer: Self-pay | Admitting: *Deleted

## 2014-05-21 ENCOUNTER — Other Ambulatory Visit: Payer: Self-pay

## 2014-05-21 ENCOUNTER — Encounter (HOSPITAL_COMMUNITY)
Admission: RE | Disposition: A | Discharge status: Home / Self Care | Payer: Self-pay | Source: Ambulatory Visit | Attending: Orthopedic Surgery

## 2014-05-21 ENCOUNTER — Ambulatory Visit (HOSPITAL_COMMUNITY)
Admission: RE | Admit: 2014-05-21 | Discharge: 2014-05-21 | Disposition: A | Discharge status: Home / Self Care | Payer: Medicare HMO | Source: Ambulatory Visit | Attending: Orthopedic Surgery | Admitting: Orthopedic Surgery

## 2014-05-21 DIAGNOSIS — Z87891 Personal history of nicotine dependence: Secondary | ICD-10-CM | POA: Insufficient documentation

## 2014-05-21 DIAGNOSIS — Z888 Allergy status to other drugs, medicaments and biological substances status: Secondary | ICD-10-CM | POA: Diagnosis not present

## 2014-05-21 DIAGNOSIS — S42002A Fracture of unspecified part of left clavicle, initial encounter for closed fracture: Secondary | ICD-10-CM | POA: Insufficient documentation

## 2014-05-21 DIAGNOSIS — K219 Gastro-esophageal reflux disease without esophagitis: Secondary | ICD-10-CM | POA: Insufficient documentation

## 2014-05-21 DIAGNOSIS — D696 Thrombocytopenia, unspecified: Secondary | ICD-10-CM | POA: Insufficient documentation

## 2014-05-21 DIAGNOSIS — Z419 Encounter for procedure for purposes other than remedying health state, unspecified: Secondary | ICD-10-CM

## 2014-05-21 DIAGNOSIS — I2510 Atherosclerotic heart disease of native coronary artery without angina pectoris: Secondary | ICD-10-CM | POA: Insufficient documentation

## 2014-05-21 DIAGNOSIS — Z951 Presence of aortocoronary bypass graft: Secondary | ICD-10-CM | POA: Insufficient documentation

## 2014-05-21 DIAGNOSIS — X58XXXA Exposure to other specified factors, initial encounter: Secondary | ICD-10-CM | POA: Insufficient documentation

## 2014-05-21 DIAGNOSIS — E785 Hyperlipidemia, unspecified: Secondary | ICD-10-CM | POA: Insufficient documentation

## 2014-05-21 DIAGNOSIS — Z7982 Long term (current) use of aspirin: Secondary | ICD-10-CM | POA: Diagnosis not present

## 2014-05-21 DIAGNOSIS — I10 Essential (primary) hypertension: Secondary | ICD-10-CM | POA: Insufficient documentation

## 2014-05-21 DIAGNOSIS — Z955 Presence of coronary angioplasty implant and graft: Secondary | ICD-10-CM | POA: Insufficient documentation

## 2014-05-21 HISTORY — PX: ORIF CLAVICULAR FRACTURE: SHX5055

## 2014-05-21 HISTORY — DX: Fracture of unspecified part of unspecified clavicle, initial encounter for closed fracture: S42.009A

## 2014-05-21 HISTORY — DX: Gastro-esophageal reflux disease without esophagitis: K21.9

## 2014-05-21 SURGERY — OPEN REDUCTION INTERNAL FIXATION (ORIF) CLAVICULAR FRACTURE
Anesthesia: General | Site: Chest | Laterality: Left

## 2014-05-21 MED ORDER — PROMETHAZINE HCL 25 MG/ML IJ SOLN: 6.2500 mg | INTRAMUSCULAR | Status: DC | PRN

## 2014-05-21 MED ORDER — SUFENTANIL CITRATE 50 MCG/ML IV SOLN
INTRAVENOUS | Status: DC | PRN
  Administered 2014-05-21: 15 ug via INTRAVENOUS

## 2014-05-21 MED ORDER — BUPIVACAINE-EPINEPHRINE 0.25% -1:200000 IJ SOLN
INTRAMUSCULAR | Status: DC | PRN
  Administered 2014-05-21: 10 mL

## 2014-05-21 MED ORDER — SUFENTANIL CITRATE 50 MCG/ML IV SOLN
INTRAVENOUS | Status: AC
  Filled 2014-05-21: qty 1

## 2014-05-21 MED ORDER — ROCURONIUM BROMIDE 50 MG/5ML IV SOLN
INTRAVENOUS | Status: AC
  Filled 2014-05-21: qty 1

## 2014-05-21 MED ORDER — MIDAZOLAM HCL 5 MG/5ML IJ SOLN
INTRAMUSCULAR | Status: DC | PRN
  Administered 2014-05-21: 2 mg via INTRAVENOUS

## 2014-05-21 MED ORDER — LACTATED RINGERS IV SOLN
INTRAVENOUS | Status: DC
  Administered 2014-05-21 (×2): via INTRAVENOUS

## 2014-05-21 MED ORDER — CEFAZOLIN SODIUM 1-5 GM-% IV SOLN
INTRAVENOUS | Status: AC
  Filled 2014-05-21: qty 50

## 2014-05-21 MED ORDER — LIDOCAINE HCL (CARDIAC) 20 MG/ML IV SOLN
INTRAVENOUS | Status: AC
  Filled 2014-05-21: qty 5

## 2014-05-21 MED ORDER — BUPIVACAINE-EPINEPHRINE (PF) 0.25% -1:200000 IJ SOLN
INTRAMUSCULAR | Status: AC
  Filled 2014-05-21: qty 30

## 2014-05-21 MED ORDER — DEXAMETHASONE SODIUM PHOSPHATE 4 MG/ML IJ SOLN
INTRAMUSCULAR | Status: DC | PRN
  Administered 2014-05-21: 8 mg via INTRAVENOUS

## 2014-05-21 MED ORDER — FENTANYL CITRATE 0.05 MG/ML IJ SOLN: 50.0000 ug | INTRAMUSCULAR | Status: DC | PRN

## 2014-05-21 MED ORDER — GLYCOPYRROLATE 0.2 MG/ML IJ SOLN
INTRAMUSCULAR | Status: DC | PRN
  Administered 2014-05-21: 0.4 mg via INTRAVENOUS

## 2014-05-21 MED ORDER — NEOSTIGMINE METHYLSULFATE 10 MG/10ML IV SOLN
INTRAVENOUS | Status: DC | PRN
  Administered 2014-05-21: 2 mg via INTRAVENOUS

## 2014-05-21 MED ORDER — CEFAZOLIN SODIUM 1-5 GM-% IV SOLN
1.0000 g | Freq: Once | INTRAVENOUS | Status: AC
  Administered 2014-05-21: 1 g via INTRAVENOUS

## 2014-05-21 MED ORDER — ROCURONIUM BROMIDE 100 MG/10ML IV SOLN
INTRAVENOUS | Status: DC | PRN
  Administered 2014-05-21: 40 mg via INTRAVENOUS

## 2014-05-21 MED ORDER — SODIUM CHLORIDE 0.9 % IR SOLN
Status: DC | PRN
  Administered 2014-05-21: 1000 mL

## 2014-05-21 MED ORDER — SODIUM CHLORIDE 0.9 % IV SOLN: 10.0000 mL/h | Freq: Once | INTRAVENOUS | Status: DC

## 2014-05-21 MED ORDER — PROPOFOL 10 MG/ML IV BOLUS
INTRAVENOUS | Status: DC | PRN
  Administered 2014-05-21: 70 mg via INTRAVENOUS

## 2014-05-21 MED ORDER — HYDROMORPHONE HCL 1 MG/ML IJ SOLN: 0.2500 mg | INTRAMUSCULAR | Status: DC | PRN

## 2014-05-21 MED ORDER — MEPERIDINE HCL 25 MG/ML IJ SOLN: 6.2500 mg | INTRAMUSCULAR | Status: DC | PRN

## 2014-05-21 MED ORDER — DOCUSATE SODIUM 100 MG PO CAPS: 100.0000 mg | ORAL_CAPSULE | Freq: Three times a day (TID) | ORAL | Status: DC | PRN

## 2014-05-21 MED ORDER — MIDAZOLAM HCL 2 MG/2ML IJ SOLN
INTRAMUSCULAR | Status: AC
  Filled 2014-05-21: qty 2

## 2014-05-21 MED ORDER — MIDAZOLAM HCL 2 MG/2ML IJ SOLN: 0.5000 mg | Freq: Once | INTRAMUSCULAR | Status: AC | PRN

## 2014-05-21 MED ORDER — GLYCOPYRROLATE 0.2 MG/ML IJ SOLN
INTRAMUSCULAR | Status: AC
  Filled 2014-05-21: qty 2

## 2014-05-21 MED ORDER — ONDANSETRON HCL 4 MG/2ML IJ SOLN
INTRAMUSCULAR | Status: DC | PRN
  Administered 2014-05-21: 4 mg via INTRAVENOUS

## 2014-05-21 MED ORDER — PROPOFOL 10 MG/ML IV BOLUS
INTRAVENOUS | Status: AC
  Filled 2014-05-21: qty 20

## 2014-05-21 MED ORDER — MIDAZOLAM HCL 2 MG/2ML IJ SOLN: 1.0000 mg | INTRAMUSCULAR | Status: DC | PRN

## 2014-05-21 MED ORDER — PHENYLEPHRINE HCL 10 MG/ML IJ SOLN
10.0000 mg | INTRAVENOUS | Status: DC | PRN
  Administered 2014-05-21: 40 ug/min via INTRAVENOUS

## 2014-05-21 MED ORDER — LIDOCAINE HCL (CARDIAC) 20 MG/ML IV SOLN
INTRAVENOUS | Status: DC | PRN
  Administered 2014-05-21: 25 mg via INTRAVENOUS

## 2014-05-21 MED ORDER — OXYCODONE-ACETAMINOPHEN 5-325 MG PO TABS: 1.0000 | ORAL_TABLET | ORAL | Status: DC | PRN

## 2014-05-21 MED ORDER — DEXAMETHASONE SODIUM PHOSPHATE 4 MG/ML IJ SOLN
INTRAMUSCULAR | Status: AC
  Filled 2014-05-21: qty 2

## 2014-05-21 MED ORDER — NEOSTIGMINE METHYLSULFATE 10 MG/10ML IV SOLN
INTRAVENOUS | Status: AC
  Filled 2014-05-21: qty 1

## 2014-05-21 MED ORDER — ONDANSETRON HCL 4 MG/2ML IJ SOLN
INTRAMUSCULAR | Status: AC
  Filled 2014-05-21: qty 2

## 2014-05-21 SURGICAL SUPPLY — 61 items
BIT DRILL 2.0 LNG QUCK RELEASE (BIT) IMPLANT
BIT DRILL 2.3 QUICK RELEASE (BIT) ×1 IMPLANT
BIT DRILL 2.8X5 QR DISP (BIT) ×2 IMPLANT
CHLORAPREP W/TINT 26ML (MISCELLANEOUS) ×2 IMPLANT
CLSR STERI-STRIP ANTIMIC 1/2X4 (GAUZE/BANDAGES/DRESSINGS) ×1 IMPLANT
COVER SURGICAL LIGHT HANDLE (MISCELLANEOUS) ×2 IMPLANT
DRAPE C-ARM 42X72 X-RAY (DRAPES) ×2 IMPLANT
DRAPE IMP U-DRAPE 54X76 (DRAPES) ×2 IMPLANT
DRAPE INCISE IOBAN 66X45 STRL (DRAPES) ×2 IMPLANT
DRAPE U-SHAPE 47X51 STRL (DRAPES) ×2 IMPLANT
DRILL 2.0 LNG QUICK RELEASE (BIT) ×2
DRILL 2.3 QUICK RELEASE (BIT) ×2
DRSG MEPILEX BORDER 4X8 (GAUZE/BANDAGES/DRESSINGS) ×1 IMPLANT
ELECT REM PT RETURN 9FT ADLT (ELECTROSURGICAL)
ELECTRODE REM PT RTRN 9FT ADLT (ELECTROSURGICAL) IMPLANT
GLOVE BIO SURGEON STRL SZ7 (GLOVE) ×2 IMPLANT
GLOVE BIO SURGEON STRL SZ7.5 (GLOVE) ×2 IMPLANT
GLOVE BIOGEL PI IND STRL 7.0 (GLOVE) ×1 IMPLANT
GLOVE BIOGEL PI IND STRL 8 (GLOVE) ×1 IMPLANT
GLOVE BIOGEL PI INDICATOR 7.0 (GLOVE) ×1
GLOVE BIOGEL PI INDICATOR 8 (GLOVE) ×1
GOWN STRL REUS W/ TWL LRG LVL3 (GOWN DISPOSABLE) ×3 IMPLANT
GOWN STRL REUS W/ TWL XL LVL3 (GOWN DISPOSABLE) ×1 IMPLANT
GOWN STRL REUS W/TWL LRG LVL3 (GOWN DISPOSABLE) ×6
GOWN STRL REUS W/TWL XL LVL3 (GOWN DISPOSABLE) ×2
KIT BASIN OR (CUSTOM PROCEDURE TRAY) ×2 IMPLANT
KIT BIO-TENODESIS 3X8 DISP (MISCELLANEOUS)
KIT INSRT BABSR STRL DISP BTN (MISCELLANEOUS) IMPLANT
KIT ROOM TURNOVER OR (KITS) ×2 IMPLANT
MANIFOLD NEPTUNE WASTE (CANNULA) ×2 IMPLANT
NDL HYPO 25GX1X1/2 BEV (NEEDLE) ×1 IMPLANT
NEEDLE HYPO 25GX1X1/2 BEV (NEEDLE) ×2 IMPLANT
NEEDLE SPNL 18GX3.5 QUINCKE PK (NEEDLE) ×2 IMPLANT
NS IRRIG 1000ML POUR BTL (IV SOLUTION) ×2 IMPLANT
PACK SHOULDER (CUSTOM PROCEDURE TRAY) ×2 IMPLANT
PACK UNIVERSAL I (CUSTOM PROCEDURE TRAY) ×2 IMPLANT
PAD ARMBOARD 7.5X6 YLW CONV (MISCELLANEOUS) ×4 IMPLANT
PLATE CLAVICLE 10 HOLE (Plate) ×2 IMPLANT
RETRIEVER SUT HEWSON (MISCELLANEOUS) IMPLANT
SCREW HEXALOBE LOCK 3.5X22MM (Screw) ×2 IMPLANT
SCREW HEXALOBE LOCKING 3.5X14M (Screw) ×2 IMPLANT
SCREW HEXALOBE NON-LOCK 3.5X14 (Screw) ×2 IMPLANT
SCREW LOCK 12X3.5X HEXALOBE (Screw) IMPLANT
SCREW LOCK 18X3.5X HEXALOBE (Screw) IMPLANT
SCREW LOCKING 3.5X12 (Screw) ×4 IMPLANT
SCREW LOCKING 3.5X18MM (Screw) ×2 IMPLANT
SCREW NON TOGG 2.3X16MM (Screw) ×1 IMPLANT
SLING ARM LRG ADULT FOAM STRAP (SOFTGOODS) ×2 IMPLANT
SLING ARM MED ADULT FOAM STRAP (SOFTGOODS) IMPLANT
STRIP CLOSURE SKIN 1/2X4 (GAUZE/BANDAGES/DRESSINGS) ×2 IMPLANT
SUCTION FRAZIER TIP 10 FR DISP (SUCTIONS) ×2 IMPLANT
SUT FIBERWIRE #2 38 T-5 BLUE (SUTURE)
SUT MON AB 4-0 PC3 18 (SUTURE) ×2 IMPLANT
SUT PDS AB 1 CT  36 (SUTURE) ×1
SUT PDS AB 1 CT 36 (SUTURE) ×1 IMPLANT
SUT VICRYL 0 CT 1 36IN (SUTURE) ×2 IMPLANT
SUTURE FIBERWR #2 38 T-5 BLUE (SUTURE) IMPLANT
SYR CONTROL 10ML LL (SYRINGE) ×2 IMPLANT
TOWEL OR 17X24 6PK STRL BLUE (TOWEL DISPOSABLE) ×2 IMPLANT
TOWEL OR 17X26 10 PK STRL BLUE (TOWEL DISPOSABLE) ×2 IMPLANT
WATER STERILE IRR 1000ML POUR (IV SOLUTION) ×2 IMPLANT

## 2014-05-21 NOTE — Op Note (Signed)
Procedure(s): OPEN REDUCTION INTERNAL FIXATION (ORIF) CLAVICULAR FRACTURE Procedure Note  Brett Chambers male 74 y.o. 05/21/2014  Procedure(s) and Anesthesia Type:    * OPEN REDUCTION INTERNAL FIXATION (ORIF) LEFT CLAVICULAR FRACTURE - Choice  Surgeon(s) and Role:    * Tania Ade, MD - Primary   Indications:  74 y.o. male s/p [facility] with left clavicle fracture. Indicated for surgery to promote anatomic restoration anatomy, improve functional outcome and avoid skin complications.     Surgeon: Nita Sells   Assistants: Jeanmarie Hubert PA-C [facility] was present and scrubbed throughout the procedure and was essential in positioning, retraction, exposure, and closure)  Anesthesia: General endotracheal anesthesia    Procedure Detail  OPEN REDUCTION INTERNAL FIXATION (ORIF) CLAVICULAR FRACTURE  Findings: Comminuted shortened and displaced fracture with 2 butterfly fragments. The largest anterior lateral fragment was fixed to the lateral piece with a 2.3 lag screw. A 10 hole locking plate was used superiorly with anatomic reduction and excellent fixation.  Estimated Blood Loss:  less than 50 mL         Drains: none  Blood Given: none         Specimens: none        Complications:  * No complications entered in OR log *         Disposition: PACU - hemodynamically stable.         Condition: stable    Procedure:  DESCRIPTION OF PROCEDURE: The patient was identified in preoperative  holding area where I personally marked the operative site after  verifying site, side, and procedure with the patient. The patient was taken back  to the operating room where general anesthesia was induced without  complication and was placed in the beach-chair position with the back  elevated about 40 degrees and all extremities carefully padded and  positioned. The neck was turned very slightly away from the operative field  to assist in exposure. The left upper extremity was then  prepped and  draped in a standard sterile fashion. The appropriate time-out  procedure was carried out. The patient did receive IV antibiotics  within 30 minutes of incision.  An incision was made in [employer] centered over the fracture site. Dissection was carried down through subcutaneous tissues and medial and lateral skin flaps were elevated.  The deltotrapezial fascia was then opened over the clavicle and the  medial and lateral fracture fragments were carefully exposed, taking great care to protect underlying neurovascular structures.  2 Butterfly fragments were kept in continuity with soft tissue as to not disrupt blood supply.  One 2.3 mm interfragmentary lag screw was used affixing the large anterior butterfly fragment with the lateral fragment. The plate was positioned on the bone using fluoroscopic imaging to verify position. Locking and non locking screws were then used to fill the plate and flouroscopic imaging demonstrated appropriate position and screw lengths.  The wound was copiously irrigated with normal saline and the deltotrapezial fascia was  then carefully closed over the construct with #0 vicryl sutures in  interrupted fashion. The skin was then closed with 2-0 Vicryl in a deep  dermal layer, 4-0 Monocryl for skin closure. Steri-Strips were applied.  10 mL of 0.25% Marcaine with epinephrine were infiltrated for  postoperative pain. Sterile dressings were applied including a medium  Mepilex dressing. The patient was then allowed to awaken from general  anesthesia, placed in a sling, transferred to stretcher and taken to the  recovery room in stable condition.   POSTOPERATIVE  PLAN: He will be observed in the recovery room. He prefers to go home but will be kept overnight for observation if there is any concern about his heart or pain control. He will resume his Plavix tomorrow.

## 2014-05-21 NOTE — Anesthesia Preprocedure Evaluation (Addendum)
Anesthesia Evaluation  Patient identified by MRN, date of birth, ID band Patient awake    Reviewed: Allergy & Precautions, NPO status , Patient's Chart, lab work & pertinent test results  History of Anesthesia Complications Negative for: history of anesthetic complications  Airway Mallampati: I  TM Distance: >3 FB Neck ROM: Full    Dental  (+) Dental Advisory Given   Pulmonary former smoker (quit 1965),  Fractured ribs on R breath sounds clear to auscultation        Cardiovascular hypertension, Pt. on medications + CAD, + Cardiac Stents and + CABG Rhythm:Regular Rate:Normal  LHC 08/29/12: LM and oCFX junction 75%, ostial LAD occluded, oCFX 75-80%, CFX stents patent with 50% ISR at prox edge, 40% before stented segment in mid CFX, pRCA occluded, S-OM occluded, S-Dx occluded, S-RCA occluded, RIMA-LAD patent, EF 55%   Neuro/Psych negative neurological ROS     GI/Hepatic Neg liver ROS, GERD-  Controlled,  Endo/Other  negative endocrine ROS  Renal/GU Renal InsufficiencyRenal disease (creat 1.47)     Musculoskeletal   Abdominal   Peds  Hematology negative hematology ROS (+)   Anesthesia Other Findings   Reproductive/Obstetrics                            Anesthesia Physical Anesthesia Plan  ASA: III  Anesthesia Plan: General   Post-op Pain Management:    Induction: Intravenous  Airway Management Planned: Oral ETT  Additional Equipment: Arterial line  Intra-op Plan:   Post-operative Plan: Extubation in OR  Informed Consent: I have reviewed the patients History and Physical, chart, labs and discussed the procedure including the risks, benefits and alternatives for the proposed anesthesia with the patient or authorized representative who has indicated his/her understanding and acceptance.   Dental advisory given  Plan Discussed with: CRNA and Surgeon  Anesthesia Plan Comments: (Plan  routine monitors, A line, GETA)        Anesthesia Quick Evaluation

## 2014-05-21 NOTE — Discharge Instructions (Signed)

## 2014-05-21 NOTE — Transfer of Care (Signed)
Immediate Anesthesia Transfer of Care Note  Patient: Brett Chambers  Procedure(s) Performed: Procedure(s) with comments: OPEN REDUCTION INTERNAL FIXATION (ORIF) CLAVICULAR FRACTURE (Left) - Open reduction internal fixation clavical fracture  Patient Location: PACU  Anesthesia Type:General  Level of Consciousness: awake and oriented  Airway & Oxygen Therapy: Patient Spontanous Breathing and Patient connected to nasal cannula oxygen  Post-op Assessment: Report given to RN, Post -op Vital signs reviewed and stable and Patient moving all extremities  Post vital signs: Reviewed and stable  Last Vitals:  Filed Vitals:   05/21/14 0951  BP: 125/66  Pulse: 68  Temp: 36.3 C  Resp: 18    Complications: No apparent anesthesia complications

## 2014-05-21 NOTE — Anesthesia Procedure Notes (Signed)
Procedure Name: Intubation Date/Time: 05/21/2014 12:33 PM Performed by: Melina Copa, Melroy Bougher R Pre-anesthesia Checklist: Patient identified, Emergency Drugs available, Suction available, Patient being monitored and Timeout performed Patient Re-evaluated:Patient Re-evaluated prior to inductionOxygen Delivery Method: Circle system utilized Preoxygenation: Pre-oxygenation with 100% oxygen Intubation Type: IV induction Ventilation: Mask ventilation without difficulty Laryngoscope Size: Mac and 4 Grade View: Grade I Tube type: Oral Tube size: 8.0 mm Number of attempts: 1 Airway Equipment and Method: Stylet Placement Confirmation: ETT inserted through vocal cords under direct vision,  positive ETCO2 and breath sounds checked- equal and bilateral Secured at: 23 cm Tube secured with: Tape Dental Injury: Teeth and Oropharynx as per pre-operative assessment

## 2014-05-21 NOTE — H&P (Signed)
Brett Chambers is an 74 y.o. male.   Chief Complaint: L clavicle injury  HPI: s/p [facility] with L displaced, comminuted clavicle fracture  Past Medical History  Diagnosis Date  . Hyperlipidemia   . Hypertension     mild hypertension  . Coronary artery disease     a. s/p CABG 1994;  B. Abnl myoview 08/2008: Anterolateral ischemia, EF 57%;  LHC 7/10: Xience DES x2 to the bifurcation lesion of the circumflex and obtuse marginal. c. Abnl myoview 08/2012-> cath with interval occ of SVG-RCA, CTO of LAD/RCA, severe prox LCx stenosis s/p FFR-guided DES to LCx  . Thrombocytopenia     a. Noted on labs 08/2012.  . Bradycardia     a. event monitor (8/14):  NSR, sinus brady, very frequent PACs and occasional atrial runs (3-4 beats).  No significant pauses.  Marland Kitchen GERD (gastroesophageal reflux disease)   . Clavicle fracture     left    Past Surgical History  Procedure Laterality Date  . Coronary artery bypass graft  1993    RIMA-LAD, SVG-1ST, 2ND DIAG, SVG-1ST, 2 ND OM, PD  . Coronary angioplasty    . Coronary stent placement  7/10    Stent- Left Cirx  . Cardiac catheterization      His most recent in August of 2010 showed totally occluded LAD,  . US echocardiography  10-29-2007    EF 55-60%  . Fractional flow reserve wire N/A 08/29/2012    Procedure: FRACTIONAL FLOW RESERVE WIRE;  Surgeon: Sherren Mocha, MD;  Location: [facility] CATH LAB;  Service: Cardiovascular;  Laterality: N/A;  . Colonoscopy    . Fracture surgery      right    Family History  Problem Relation Age of Onset  . Heart disease Brother   . Stroke Other   . Hypertension Other    Social History:  reports that he quit smoking about 51 years ago. He has never used smokeless tobacco. He reports that he does not drink alcohol or use illicit drugs.  Allergies:  Allergies  Allergen Reactions  . Lipitor [Atorvastatin] Itching and Rash    Medications Prior to Admission  Medication Sig Dispense Refill  . aspirin EC 81 MG tablet Take 1 tablet  (81 mg total) by mouth daily. (Patient taking differently: Take 162 mg by mouth daily. )    . clopidogrel (PLAVIX) 75 MG tablet TAKE ONE TABLET BY MOUTH ONCE DAILY 90 tablet 3  . isosorbide mononitrate (IMDUR) 30 MG 24 hr tablet TAKE ONE-HALF TABLET BY MOUTH ONCE DAILY 45 tablet 3  . lisinopril (PRINIVIL,ZESTRIL) 5 MG tablet Take 0.5 tablets (2.5 mg total) by mouth daily. 45 tablet 3  . oxyCODONE-acetaminophen (PERCOCET/ROXICET) 5-325 MG per tablet Take 1 tablet by mouth every 4 (four) hours as needed for severe pain.    . rosuvastatin (CRESTOR) 20 MG tablet Take 1 tablet (20 mg total) by mouth daily. 90 tablet 3  . nitroGLYCERIN (NITROSTAT) 0.4 MG SL tablet Place 1 tablet (0.4 mg total) under the tongue every 5 (five) minutes as needed for chest pain. 25 tablet 3    No results found for this or any previous visit (from the past 48 hour(s)). No results found.  Review of Systems  All other systems reviewed and are negative.   Blood pressure 125/66, pulse 68, temperature 97.3 F (36.3 C), temperature source Oral, resp. rate 18, height 6' (1.829 m), weight 86.183 kg (190 lb), SpO2 98 %. Physical Exam  Constitutional: He is oriented to  person, place, and time. He appears well-developed and well-nourished.  HENT:  Head: Atraumatic.  Eyes: EOM are normal.  Cardiovascular: Intact distal pulses.   Respiratory: Effort normal.  Musculoskeletal:  L clavicle obvious deformity, TTP, NVID  Neurological: He is alert and oriented to person, place, and time.  Skin: Skin is warm and dry.  Psychiatric: He has a normal mood and affect.     Assessment/Plan L displaced comminuted, shortened clavicle fracture Plan ORIF  Stopped Plavix 3 days ago, continued asa, will restart Plavix tomorrow. Risks / benefits of surgery discussed Consent on chart  NPO for OR Preop antibiotics   Brett Chambers 05/21/2014, 11:49 AM

## 2014-05-22 ENCOUNTER — Encounter (HOSPITAL_COMMUNITY): Payer: Self-pay | Admitting: Orthopedic Surgery

## 2014-05-22 NOTE — Anesthesia Postprocedure Evaluation (Signed)
  Anesthesia Post-op Note  Patient: Brett Chambers  Procedure(s) Performed: Procedure(s) with comments: OPEN REDUCTION INTERNAL FIXATION (ORIF) CLAVICULAR FRACTURE (Left) - Open reduction internal fixation clavical fracture  Patient Location: PACU  Anesthesia Type:General  Level of Consciousness: awake, alert , oriented and patient cooperative  Airway and Oxygen Therapy: Patient Spontanous Breathing  Post-op Pain: mild  Post-op Assessment: Post-op Vital signs reviewed, Patient's Cardiovascular Status Stable, Respiratory Function Stable, Patent Airway, No signs of Nausea or vomiting and Pain level controlled  Post-op Vital Signs: Reviewed and stable  Last Vitals:  Filed Vitals:   05/21/14 1600  BP:   Pulse: 77  Temp:   Resp: 19    Complications: No apparent anesthesia complications (delayed note, pt eval in PACU)

## 2014-06-23 ENCOUNTER — Encounter: Payer: Self-pay | Admitting: Cardiovascular Disease

## 2014-08-10 ENCOUNTER — Other Ambulatory Visit: Payer: Self-pay

## 2014-08-19 ENCOUNTER — Other Ambulatory Visit: Payer: Self-pay | Admitting: Cardiovascular Disease

## 2014-08-20 ENCOUNTER — Other Ambulatory Visit: Payer: Self-pay

## 2014-08-20 DIAGNOSIS — E785 Hyperlipidemia, unspecified: Secondary | ICD-10-CM

## 2014-08-20 MED ORDER — ISOSORBIDE MONONITRATE ER 30 MG PO TB24: ORAL_TABLET | ORAL | Status: DC

## 2014-08-20 MED ORDER — ROSUVASTATIN CALCIUM 20 MG PO TABS: 20.0000 mg | ORAL_TABLET | Freq: Every day | ORAL | Status: DC
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
## 2014-08-28 ENCOUNTER — Ambulatory Visit (INDEPENDENT_AMBULATORY_CARE_PROVIDER_SITE_OTHER): Payer: Medicare HMO | Admitting: Physician Assistant

## 2014-08-28 ENCOUNTER — Encounter: Payer: Self-pay | Admitting: Physician Assistant

## 2014-08-28 VITALS — BP 130/74 | HR 58 | Ht 72.0 in | Wt 191.0 lb

## 2014-08-28 DIAGNOSIS — E785 Hyperlipidemia, unspecified: Secondary | ICD-10-CM

## 2014-08-28 DIAGNOSIS — I2510 Atherosclerotic heart disease of native coronary artery without angina pectoris: Secondary | ICD-10-CM | POA: Diagnosis not present

## 2014-08-28 DIAGNOSIS — I10 Essential (primary) hypertension: Secondary | ICD-10-CM | POA: Diagnosis not present

## 2014-08-28 MED ORDER — CLOPIDOGREL BISULFATE 75 MG PO TABS: ORAL_TABLET | ORAL | Status: DC

## 2014-08-28 MED ORDER — ISOSORBIDE MONONITRATE ER 30 MG PO TB24: ORAL_TABLET | ORAL | Status: DC

## 2014-08-28 NOTE — Patient Instructions (Signed)
Medication Instructions:  REFILLS SENT IN FOR PLAVIX AND IMDUR   Labwork: NONE  Testing/Procedures: NONE  Follow-Up: Your physician wants you to follow-up in: [name]. You will receive a reminder letter in the mail two months in advance. If you don't receive a letter, please call our office to schedule the follow-up appointment.   Any Other Special Instructions Will Be Listed Below (If Applicable).

## 2014-10-26 ENCOUNTER — Other Ambulatory Visit: Payer: Self-pay

## 2014-10-26 MED ORDER — LISINOPRIL 5 MG PO TABS: 5.0000 mg | ORAL_TABLET | Freq: Every day | ORAL | Status: DC

## 2014-11-18 ENCOUNTER — Other Ambulatory Visit: Payer: Self-pay | Admitting: Cardiovascular Disease

## 2014-11-24 DIAGNOSIS — Z23 Encounter for immunization: Secondary | ICD-10-CM | POA: Diagnosis not present

## 2015-06-25 DIAGNOSIS — E038 Other specified hypothyroidism: Secondary | ICD-10-CM | POA: Diagnosis not present

## 2015-06-25 DIAGNOSIS — E784 Other hyperlipidemia: Secondary | ICD-10-CM | POA: Diagnosis not present

## 2015-06-25 DIAGNOSIS — Z125 Encounter for screening for malignant neoplasm of prostate: Secondary | ICD-10-CM | POA: Diagnosis not present

## 2015-06-25 DIAGNOSIS — I10 Essential (primary) hypertension: Secondary | ICD-10-CM | POA: Diagnosis not present

## 2015-07-02 DIAGNOSIS — E784 Other hyperlipidemia: Secondary | ICD-10-CM | POA: Diagnosis not present

## 2015-07-02 DIAGNOSIS — Z9861 Coronary angioplasty status: Secondary | ICD-10-CM | POA: Diagnosis not present

## 2015-07-02 DIAGNOSIS — R0989 Other specified symptoms and signs involving the circulatory and respiratory systems: Secondary | ICD-10-CM | POA: Diagnosis not present

## 2015-07-02 DIAGNOSIS — Z955 Presence of coronary angioplasty implant and graft: Secondary | ICD-10-CM | POA: Diagnosis not present

## 2015-07-02 DIAGNOSIS — R8290 Unspecified abnormal findings in urine: Secondary | ICD-10-CM | POA: Diagnosis not present

## 2015-07-02 DIAGNOSIS — I10 Essential (primary) hypertension: Secondary | ICD-10-CM | POA: Diagnosis not present

## 2015-07-02 DIAGNOSIS — Z1389 Encounter for screening for other disorder: Secondary | ICD-10-CM | POA: Diagnosis not present

## 2015-07-02 DIAGNOSIS — K219 Gastro-esophageal reflux disease without esophagitis: Secondary | ICD-10-CM | POA: Diagnosis not present

## 2015-07-02 DIAGNOSIS — E038 Other specified hypothyroidism: Secondary | ICD-10-CM | POA: Diagnosis not present

## 2015-07-02 DIAGNOSIS — Z0000 Encounter for general adult medical examination without abnormal findings: Secondary | ICD-10-CM | POA: Diagnosis not present

## 2015-07-05 ENCOUNTER — Encounter: Payer: Self-pay | Admitting: Cardiovascular Disease

## 2015-07-08 DIAGNOSIS — Z1212 Encounter for screening for malignant neoplasm of rectum: Secondary | ICD-10-CM | POA: Diagnosis not present

## 2015-07-19 DIAGNOSIS — R319 Hematuria, unspecified: Secondary | ICD-10-CM | POA: Diagnosis not present

## 2015-08-06 ENCOUNTER — Ambulatory Visit (HOSPITAL_COMMUNITY)
Admission: RE | Admit: 2015-08-06 | Discharge: 2015-08-06 | Disposition: A | Discharge status: Home / Self Care | Payer: Medicare HMO | Source: Ambulatory Visit | Attending: Vascular Surgery | Admitting: Vascular Surgery

## 2015-08-06 ENCOUNTER — Other Ambulatory Visit (HOSPITAL_COMMUNITY): Payer: Self-pay | Admitting: Internal Medicine

## 2015-08-06 DIAGNOSIS — K219 Gastro-esophageal reflux disease without esophagitis: Secondary | ICD-10-CM | POA: Insufficient documentation

## 2015-08-06 DIAGNOSIS — R0989 Other specified symptoms and signs involving the circulatory and respiratory systems: Secondary | ICD-10-CM

## 2015-08-06 DIAGNOSIS — I6523 Occlusion and stenosis of bilateral carotid arteries: Secondary | ICD-10-CM | POA: Diagnosis not present

## 2015-08-06 DIAGNOSIS — I2510 Atherosclerotic heart disease of native coronary artery without angina pectoris: Secondary | ICD-10-CM | POA: Diagnosis not present

## 2015-08-06 DIAGNOSIS — I10 Essential (primary) hypertension: Secondary | ICD-10-CM | POA: Diagnosis not present

## 2015-08-06 DIAGNOSIS — E785 Hyperlipidemia, unspecified: Secondary | ICD-10-CM | POA: Insufficient documentation

## 2015-08-06 LAB — VAS US CAROTID
LCCADDIAS: -21 cm/s
LEFT ECA DIAS: -15 cm/s
LEFT VERTEBRAL DIAS: -7 cm/s
LICADDIAS: -29 cm/s
LICAPDIAS: 17 cm/s
LICAPSYS: 64 cm/s
Left CCA dist sys: -82 cm/s
Left CCA prox dias: 24 cm/s
Left CCA prox sys: 120 cm/s
Left ICA dist sys: -95 cm/s
RIGHT CCA MID DIAS: 24 cm/s
RIGHT ECA DIAS: -27 cm/s
RIGHT VERTEBRAL DIAS: -15 cm/s
Right CCA prox dias: 18 cm/s
Right CCA prox sys: 92 cm/s
Right cca dist sys: -85 cm/s

## 2015-09-01 ENCOUNTER — Other Ambulatory Visit: Payer: Self-pay | Admitting: Cardiovascular Disease

## 2015-09-04 ENCOUNTER — Other Ambulatory Visit: Payer: Self-pay | Admitting: Physician Assistant

## 2015-09-20 DIAGNOSIS — Z1211 Encounter for screening for malignant neoplasm of colon: Secondary | ICD-10-CM | POA: Diagnosis not present

## 2015-09-28 ENCOUNTER — Encounter: Payer: Self-pay | Admitting: Physician Assistant

## 2015-10-01 ENCOUNTER — Other Ambulatory Visit: Payer: Self-pay | Admitting: Cardiovascular Disease

## 2015-10-11 NOTE — Progress Notes (Signed)
Cardiology Office Note:    Date:  10/12/2015   ID:  Brett Chambers, DOB [birth year], MRN [id number]  PCP:  [name], MD  Cardiologist:  Dr. Liam Rogers   Electrophysiologist:  n/a  Referring MD: [name], MD   Chief Complaint  Patient presents with  . Follow-up    CAD    History of Present Illness:    Brett Chambers is a 75 y.o. male with a hx of CAD, s/p CABG in 1994, HTN, HL. LHC in 08/2008 demonstrated 2/4 grafts patent with high grade disease at the bifurcation of the CFX and OM. He underwent PCI with a Xience DES x2. Relook catheterization 09/2008 demonstrated stable anatomy and patent LCx stents. In 7/14, the patient complained of chest discomfort. Stress testing was high risk with anterolateral ischemia. LHC in 7/14 demonstrated occlusion of the SVG-RCA and severe proximal LCx stenosis. He underwent FFR guided PCI with Xience DES to the ostial LCx. PCI of the extensive CTO of the RCA would be technically challenging and this was treated medically.  Last seen by me in 7/16. He returns for follow-up. He is here today with his wife. He complains of progressively worsening dyspnea with exertion over the past 2-3 months. He has had to stop walking secondary to this. His weight is up 16 pounds since he stopped walking. He denies orthopnea or PND. He does awaken sometimes at night feeling like he is breathing more rapid. He seems to hear a wheezing sound. He denies syncope. He has noted that his blood pressure is higher recently. He denies any chest discomfort. He has never really had classic anginal symptoms.  Prior CV studies that were reviewed today include:    Carotid US 6/17 < 40% bilateral ICA stenosis  Event monitor 8/14:    NSR, sinus brady, very frequent PACs and occasional atrial runs (3-4 beats). No significant pauses  LHC 08/29/12:  LM and oCFX junction 75% LAD ostial occluded CFX ostial 75-80%, CFX stents patent with 50% ISR at prox edge, 40% before stented  segment in mid CFX RCA proximal occluded S-OM occluded S-Dx occluded S-RCA occluded new RIMA-LAD patent EF 55%. FFR guided PCI:  Xience DES to the ostial CFX.  Myoview 08/2012 High risk stress nuclear study Marked ECG abnormalities Inferior wall infarct base and mid level with severe ischemia in moderate anterolateral sized area from apex to base.  Echo 9/09:  EF 55-60%, mild MR, mild TR, mild AI, mild Ao sclerosis.   Myoview 08/2008 Anterolateral ischemia, EF 57%.   Past Medical History:  Diagnosis Date  . Bradycardia    a. event monitor (8/14):  NSR, sinus brady, very frequent PACs and occasional atrial runs (3-4 beats).  No significant pauses.  . Clavicle fracture    left  . Coronary artery disease    a. s/p CABG 1994;  B. Abnl myoview 08/2008: Anterolateral ischemia, EF 57%;  LHC 7/10: Xience DES x2 to the bifurcation lesion of the circumflex and obtuse marginal. c. Abnl myoview 08/2012-> cath with interval occ of SVG-RCA, CTO of LAD/RCA, severe prox LCx stenosis s/p FFR-guided DES to LCx  . GERD (gastroesophageal reflux disease)   . Hyperlipidemia   . Hypertension    mild hypertension  . Thrombocytopenia ([place])    a. Noted on labs 08/2012.    Past Surgical History:  Procedure Laterality Date  . CARDIAC CATHETERIZATION     His most recent in August of 2010 showed totally occluded LAD,  . COLONOSCOPY    .  CORONARY ANGIOPLASTY    . CORONARY ARTERY BYPASS GRAFT  1993   RIMA-LAD, SVG-1ST, 2ND DIAG, SVG-1ST, 2 ND OM, PD  . CORONARY STENT PLACEMENT  7/10   Stent- Left Cirx  . FRACTIONAL FLOW RESERVE WIRE N/A 08/29/2012   Procedure: FRACTIONAL FLOW RESERVE WIRE;  Surgeon: Sherren Mocha, MD;  Location: [facility] CATH LAB;  Service: Cardiovascular;  Laterality: N/A;  . FRACTURE SURGERY     right  . ORIF CLAVICULAR FRACTURE Left 05/21/2014   Procedure: OPEN REDUCTION INTERNAL FIXATION (ORIF) CLAVICULAR FRACTURE;  Surgeon: Tania Ade, MD;  Location: [facility];  Service: Orthopedics;   Laterality: Left;  Open reduction internal fixation clavical fracture  . US ECHOCARDIOGRAPHY  10-29-2007   EF 55-60%    Current Medications: Outpatient Medications Prior to Visit  Medication Sig Dispense Refill  . aspirin EC 81 MG tablet Take 81 mg by mouth daily.    . clopidogrel (PLAVIX) 75 MG tablet TAKE ONE TABLET BY MOUTH ONCE DAILY 30 tablet 0  . lisinopril (PRINIVIL,ZESTRIL) 5 MG tablet Take 1 tablet (5 mg total) by mouth daily. (Patient taking differently: Take 5 mg by mouth daily with breakfast. ) 90 tablet 3  . isosorbide mononitrate (IMDUR) 30 MG 24 hr tablet TAKE ONE-HALF TABLET BY MOUTH ONCE DAILY 90 tablet 3  . nitroGLYCERIN (NITROSTAT) 0.4 MG SL tablet Place 1 tablet (0.4 mg total) under the tongue every 5 (five) minutes as needed for chest pain. 25 tablet 3  . rosuvastatin (CRESTOR) 20 MG tablet TAKE ONE TABLET BY MOUTH ONCE DAILY 90 tablet 3  . isosorbide mononitrate (IMDUR) 30 MG 24 hr tablet TAKE ONE-HALF TABLET BY MOUTH ONCE DAILY (Patient not taking: Reported on 10/12/2015) 15 tablet 0   No facility-administered medications prior to visit.       Allergies:   Lipitor [atorvastatin]   Social History   Social History  . Marital status: Married    Spouse name: N/A  . Number of children: N/A  . Years of education: N/A   Social History Main Topics  . Smoking status: Former Smoker    Quit date: 02/14/1963  . Smokeless tobacco: Never Used  . Alcohol use No  . Drug use: No  . Sexual activity: Not Asked   Other Topics Concern  . None   Social History Narrative  . None     Family History:  The patient's family history includes Heart disease in his brother; Hypertension in his other; Stroke in his other.   ROS:   Please see the history of present illness.    Review of Systems  Cardiovascular: Positive for irregular heartbeat.  Respiratory: Positive for cough, shortness of breath and wheezing.   Hematologic/Lymphatic: Bruises/bleeds easily.   All other systems  reviewed and are negative.   EKGs/Labs/Other Test Reviewed:    EKG:  EKG is  ordered today.  The ekg ordered today demonstrates Sinus bradycardia, HR 59, normal axis, RSR prime V1-V2, QTc 443 ms, nonspecific ST-T wave changes, no significant change since prior tracing dated 08/28/14  Recent Labs: 10/12/2015: Brain Natriuretic Peptide 299.8; BUN 10; Creat 1.26; Potassium 3.8; Sodium 142   Labs from PCP 5/17: LDL 71, serum creatinine 1.1, TSH 4.84, ALT 15, hemoglobin 14.8  Recent Lipid Panel    Component Value Date/Time   CHOL 114 07/09/2012 0745   TRIG 94.0 07/09/2012 0745   HDL 39.70 07/09/2012 0745   CHOLHDL 3 07/09/2012 0745   VLDL 18.8 07/09/2012 0745   LDLCALC 56 07/09/2012 0745  Physical Exam:    VS:  BP (!) 170/80   Pulse 60   Ht 6\' 1"  (1.854 m)   Wt 207 lb 12.8 oz (94.3 kg)   SpO2 97%   BMI 27.42 kg/m     Wt Readings from Last 3 Encounters:  10/12/15 207 lb 12.8 oz (94.3 kg)  08/28/14 191 lb (86.6 kg)  05/21/14 190 lb (86.2 kg)     Physical Exam  Constitutional: He is oriented to person, place, and time. He appears well-developed and well-nourished. No distress.  HENT:  Head: Normocephalic and atraumatic.  Eyes: No scleral icterus.  Neck: Normal range of motion. No JVD present.  Cardiovascular: Normal rate, regular rhythm, S1 normal and S2 normal.  Exam reveals no gallop and no friction rub.   No murmur heard. Pulmonary/Chest: Effort normal. He has decreased breath sounds. He has no wheezes. He has no rhonchi. He has no rales.  Abdominal: Soft. There is no tenderness.  Musculoskeletal: He exhibits no edema.  Neurological: He is alert and oriented to person, place, and time.  Skin: Skin is warm and dry.  Psychiatric: He has a normal mood and affect.    ASSESSMENT:    1. Dyspnea   2. Coronary artery disease involving native coronary artery of native heart without angina pectoris   3. Essential hypertension   4. Hypercholesterolemia    PLAN:    In  order of problems listed above:  1. Dyspnea - I suspect that his dyspnea is likely an anginal equivalent. He has significant disease as outlined above on cardiac catheterization in 2014. I reviewed his case today with Dr. [name]. We feel that nuclear stress testing would likely not be helpful given his known disease. Therefore, we recommend proceeding with cardiac catheterization to further evaluate.  Risks and benefits of cardiac catheterization have been discussed with the patient.  These include bleeding, infection, kidney damage, stroke, heart attack, death.  The patient understands these risks and is willing to proceed.   -  Labs today:  BMET, BNP, CBC, INR.  -  Increase Imdur to 30 mg QD  -  Proceed with LHC next 1-2 weeks.   2. Coronary artery disease - s/p CABG in 1994, DES 2 to the LCx/OM bifurcation in 2010 and DES 1 to the ostial LCx in 2014.   By last catheterization, 3 out of 4 grafts were occluded. He has a chronic total occlusion of the RCA that was treated medically. As noted, he has progressively worsening dyspnea on exertion that is likely angina. Cardiac catheterization will be arranged. I will increase his isosorbide as outlined above. Continue ASA, Plavix, statin, ACE inhibitor.  3. Essential hypertension - BP elevated. Increase Isosorbide as noted.   4. Hyperlipidemia - LDL in 5/17 was 71. Continue statin.   Medication Adjustments/Labs and Tests Ordered: Current medicines are reviewed at length with the patient today.  Concerns regarding medicines are outlined above.  Medication changes, Labs and Tests ordered today are outlined in the Patient Instructions noted below. Patient Instructions  Medication Instructions:  1. INCREASE IMDUR TO 30 MG DAILY; NEW RX SENT TODAY 2. YOU HAVE BEEN G[facility]EN [facility] Labwork: 1. TODAY BMET, CBC W/DIFF, PT/INR, BNP Testing/Procedures: 1. Your physician has requested that you have a cardiac catheterization.  Cardiac catheterization is used to diagnose and/or treat various heart conditions. Doctors may recommend this procedure for a number of different reasons. The most common reason is to evaluate chest  pain. Chest pain can be a symptom of coronary artery disease (CAD), and cardiac catheterization can show whether plaque is narrowing or blocking your heart's arteries. This procedure is also used to evaluate the valves, as well as measure the blood flow and oxygen levels in different parts of your heart. For further information please visit [facility]iest[employer]tn. Please follow instruction sheet, as given. CATH IS ON 10/14/15 2. Your physician has requested that you have an echocardiogram. Echocardiography is a painless test that uses sound waves to create images of your heart. It provides your doctor with information about the size and shape of your heart and how well your heart's chambers and valves are working. This procedure takes approximately one hour. There are no restrictions for this procedure. HAVE ECHO DONE SOMETIME BEFORE YOUR FOLLOW UP APPT WITH SCOTT WEAVER, PAC  Follow-Up: SCOTT WEAVER, PAC 2 WEEKS POST CATH  Any Other Special Instructions Will Be Listed Below (If Applicable). If you need a refill on your cardiac medications before your next appointment, please call your pharmacy.  Signed, [name], PA-C  10/12/2015 5:15 PM    [facility] [place], [place],   [address] Phone: [phone]; Fax: [phone]

## 2015-10-12 ENCOUNTER — Ambulatory Visit (INDEPENDENT_AMBULATORY_CARE_PROVIDER_SITE_OTHER): Payer: Medicare HMO | Admitting: Physician Assistant

## 2015-10-12 ENCOUNTER — Encounter: Payer: Self-pay | Admitting: Physician Assistant

## 2015-10-12 ENCOUNTER — Encounter: Payer: Self-pay | Admitting: *Deleted

## 2015-10-12 VITALS — BP 170/80 | HR 60 | Ht 73.0 in | Wt 207.8 lb

## 2015-10-12 DIAGNOSIS — E7800 Pure hypercholesterolemia, unspecified: Secondary | ICD-10-CM | POA: Diagnosis not present

## 2015-10-12 DIAGNOSIS — I10 Essential (primary) hypertension: Secondary | ICD-10-CM | POA: Diagnosis not present

## 2015-10-12 DIAGNOSIS — I2510 Atherosclerotic heart disease of native coronary artery without angina pectoris: Secondary | ICD-10-CM | POA: Diagnosis not present

## 2015-10-12 DIAGNOSIS — R0600 Dyspnea, unspecified: Secondary | ICD-10-CM

## 2015-10-12 LAB — CBC WITH DIFFERENTIAL/PLATELET
BASOS ABS: 0 {cells}/uL (ref 0–200)
BASOS PCT: 0 %
EOS ABS: 360 {cells}/uL (ref 15–500)
Eosinophils Relative: 5 %
HEMATOCRIT: 40.1 % (ref 38.5–50.0)
HEMOGLOBIN: 13.3 g/dL (ref 13.2–17.1)
LYMPHS ABS: 1368 {cells}/uL (ref 850–3900)
Lymphocytes Relative: 19 %
MCH: 28.8 pg (ref 27.0–33.0)
MCHC: 33.2 g/dL (ref 32.0–36.0)
MCV: 86.8 fL (ref 80.0–100.0)
MONO ABS: 864 {cells}/uL (ref 200–950)
MONOS PCT: 12 %
MPV: 12.7 fL — AB (ref 7.5–12.5)
NEUTROS ABS: 4608 {cells}/uL (ref 1500–7800)
Neutrophils Relative %: 64 %
PLATELETS: 121 10*3/uL — AB (ref 140–400)
RBC: 4.62 MIL/uL (ref 4.20–5.80)
RDW: 14.2 % (ref 11.0–15.0)
WBC: 7.2 10*3/uL (ref 3.8–10.8)

## 2015-10-12 LAB — BASIC METABOLIC PANEL
BUN: 10 mg/dL (ref 7–25)
CALCIUM: 8.9 mg/dL (ref 8.6–10.3)
CHLORIDE: 106 mmol/L (ref 98–110)
CO2: 27 mmol/L (ref 20–31)
Creat: 1.26 mg/dL — ABNORMAL HIGH (ref 0.70–1.18)
GLUCOSE: 81 mg/dL (ref 65–99)
POTASSIUM: 3.8 mmol/L (ref 3.5–5.3)
SODIUM: 142 mmol/L (ref 135–146)

## 2015-10-12 LAB — BRAIN NATRIURETIC PEPTIDE: Brain Natriuretic Peptide: 299.8 pg/mL — ABNORMAL HIGH (ref <100)

## 2015-10-12 LAB — PROTIME-INR
INR: 1
Prothrombin Time: 10.7 s (ref 9.0–11.5)

## 2015-10-12 MED ORDER — NITROGLYCERIN 0.4 MG SL SUBL: 0.4000 mg | SUBLINGUAL_TABLET | SUBLINGUAL | 3 refills | Status: DC | PRN

## 2015-10-12 MED ORDER — ROSUVASTATIN CALCIUM 20 MG PO TABS: 20.0000 mg | ORAL_TABLET | Freq: Every day | ORAL | 3 refills | Status: DC

## 2015-10-12 MED ORDER — ISOSORBIDE MONONITRATE ER 30 MG PO TB24: 30.0000 mg | ORAL_TABLET | Freq: Every day | ORAL | 3 refills | Status: DC

## 2015-10-12 NOTE — Patient Instructions (Addendum)
Medication Instructions:  1. INCREASE IMDUR TO 30 MG DAILY; NEW RX SENT TODAY 2. YOU HAVE BEEN GIVEN AN [facility] T[facility] Labwork: 1. TODAY BMET, CBC W/DIFF, PT/INR, BNP Testing/Procedures: 1. Your physician has requested that you have a cardiac catheterization. Cardiac catheterization is used to diagnose and/or treat various heart conditions. Doctors may recommend this procedure for a number of different reasons. The most common reason is to evaluate chest pain. Chest pain can be a symptom of coronary artery disease (CAD), and cardiac catheterization can show whether plaque is narrowing or blocking your heart's arteries. This procedure is also used to evaluate the valves, as well as measure the blood flow and oxygen levels in different parts of your heart. For further information please visit [employer]tn. Please follow instruction sheet, as given. CATH IS ON 10/14/15 2. Your physician has requested that you have an echocardiogram. Echocardiography is a painless test that uses sound waves to create images of your heart. It provides your doctor with information about the size and shape of your heart and how well your heart's chambers and valves are working. This procedure takes approximately one hour. There are no restrictions for this procedure. HAVE ECHO DONE SOMETIME BEFORE YOUR FOLLOW UP APPT WITH SCOTT WEAVER, PAC  Follow-Up: SCOTT WEAVER, PAC 2 WEEKS POST CATH  Any Other Special Instructions Will Be Listed Below (If Applicable). If you need a refill on your cardiac medications before your next appointment, please call your pharmacy.

## 2015-10-13 ENCOUNTER — Telehealth: Payer: Self-pay | Admitting: *Deleted

## 2015-10-13 NOTE — Telephone Encounter (Signed)
Both pt and wife have been notified of lab results by phone with verba understanding.

## 2015-10-14 ENCOUNTER — Encounter (HOSPITAL_COMMUNITY)
Admission: RE | Disposition: A | Discharge status: Home / Self Care | Payer: Self-pay | Source: Ambulatory Visit | Attending: Cardiology

## 2015-10-14 ENCOUNTER — Ambulatory Visit (HOSPITAL_COMMUNITY)
Admission: RE | Admit: 2015-10-14 | Discharge: 2015-10-14 | Disposition: A | Discharge status: Home / Self Care | Payer: Medicare HMO | Source: Ambulatory Visit | Attending: Cardiology | Admitting: Cardiology

## 2015-10-14 DIAGNOSIS — I209 Angina pectoris, unspecified: Secondary | ICD-10-CM | POA: Diagnosis present

## 2015-10-14 DIAGNOSIS — R0600 Dyspnea, unspecified: Secondary | ICD-10-CM | POA: Diagnosis present

## 2015-10-14 DIAGNOSIS — I25118 Atherosclerotic heart disease of native coronary artery with other forms of angina pectoris: Secondary | ICD-10-CM

## 2015-10-14 DIAGNOSIS — Z8249 Family history of ischemic heart disease and other diseases of the circulatory system: Secondary | ICD-10-CM | POA: Diagnosis not present

## 2015-10-14 DIAGNOSIS — I25718 Atherosclerosis of autologous vein coronary artery bypass graft(s) with other forms of angina pectoris: Secondary | ICD-10-CM

## 2015-10-14 DIAGNOSIS — E7800 Pure hypercholesterolemia, unspecified: Secondary | ICD-10-CM | POA: Diagnosis not present

## 2015-10-14 DIAGNOSIS — Z7982 Long term (current) use of aspirin: Secondary | ICD-10-CM | POA: Insufficient documentation

## 2015-10-14 DIAGNOSIS — Z7902 Long term (current) use of antithrombotics/antiplatelets: Secondary | ICD-10-CM | POA: Insufficient documentation

## 2015-10-14 DIAGNOSIS — I10 Essential (primary) hypertension: Secondary | ICD-10-CM | POA: Insufficient documentation

## 2015-10-14 DIAGNOSIS — Z823 Family history of stroke: Secondary | ICD-10-CM | POA: Diagnosis not present

## 2015-10-14 DIAGNOSIS — I2582 Chronic total occlusion of coronary artery: Secondary | ICD-10-CM | POA: Diagnosis not present

## 2015-10-14 DIAGNOSIS — E785 Hyperlipidemia, unspecified: Secondary | ICD-10-CM | POA: Insufficient documentation

## 2015-10-14 DIAGNOSIS — K219 Gastro-esophageal reflux disease without esophagitis: Secondary | ICD-10-CM | POA: Insufficient documentation

## 2015-10-14 DIAGNOSIS — Z955 Presence of coronary angioplasty implant and graft: Secondary | ICD-10-CM | POA: Diagnosis not present

## 2015-10-14 DIAGNOSIS — Z87891 Personal history of nicotine dependence: Secondary | ICD-10-CM | POA: Insufficient documentation

## 2015-10-14 DIAGNOSIS — I25119 Atherosclerotic heart disease of native coronary artery with unspecified angina pectoris: Secondary | ICD-10-CM | POA: Diagnosis present

## 2015-10-14 DIAGNOSIS — I2510 Atherosclerotic heart disease of native coronary artery without angina pectoris: Secondary | ICD-10-CM

## 2015-10-14 DIAGNOSIS — I25810 Atherosclerosis of coronary artery bypass graft(s) without angina pectoris: Secondary | ICD-10-CM | POA: Diagnosis present

## 2015-10-14 HISTORY — PX: CARDIAC CATHETERIZATION: SHX172

## 2015-10-14 SURGERY — LEFT HEART CATH AND CORONARY ANGIOGRAPHY

## 2015-10-14 MED ORDER — HEPARIN (PORCINE) IN NACL 2-0.9 UNIT/ML-% IJ SOLN
INTRAMUSCULAR | Status: DC | PRN
  Administered 2015-10-14: 1000 mL

## 2015-10-14 MED ORDER — LIDOCAINE HCL (PF) 1 % IJ SOLN
INTRAMUSCULAR | Status: DC | PRN
  Administered 2015-10-14: 2 mL via INTRADERMAL

## 2015-10-14 MED ORDER — SODIUM CHLORIDE 0.9% FLUSH
3.0000 mL | Freq: Two times a day (BID) | INTRAVENOUS | Status: DC
Start: 2015-10-14 — End: 2015-10-14

## 2015-10-14 MED ORDER — ACETAMINOPHEN 325 MG PO TABS: 650.0000 mg | ORAL_TABLET | ORAL | Status: DC | PRN

## 2015-10-14 MED ORDER — MIDAZOLAM HCL 2 MG/2ML IJ SOLN
INTRAMUSCULAR | Status: DC | PRN
  Administered 2015-10-14: 2 mg via INTRAVENOUS
  Administered 2015-10-14: 1 mg via INTRAVENOUS

## 2015-10-14 MED ORDER — SODIUM CHLORIDE 0.9 % WEIGHT BASED INFUSION: 1.0000 mL/kg/h | INTRAVENOUS | Status: DC

## 2015-10-14 MED ORDER — SODIUM CHLORIDE 0.9 % WEIGHT BASED INFUSION
3.0000 mL/kg/h | INTRAVENOUS | Status: DC
Start: 2015-10-14 — End: 2015-10-14

## 2015-10-14 MED ORDER — SODIUM CHLORIDE 0.9 % IV SOLN: 250.0000 mL | INTRAVENOUS | Status: DC | PRN

## 2015-10-14 MED ORDER — MIDAZOLAM HCL 2 MG/2ML IJ SOLN
INTRAMUSCULAR | Status: AC
  Filled 2015-10-14: qty 2

## 2015-10-14 MED ORDER — IOPAMIDOL (ISOVUE-370) INJECTION 76%
INTRAVENOUS | Status: AC
  Filled 2015-10-14: qty 125

## 2015-10-14 MED ORDER — HEPARIN (PORCINE) IN NACL 2-0.9 UNIT/ML-% IJ SOLN
INTRAMUSCULAR | Status: AC
  Filled 2015-10-14: qty 1000

## 2015-10-14 MED ORDER — IOPAMIDOL (ISOVUE-370) INJECTION 76%
INTRAVENOUS | Status: DC | PRN
  Administered 2015-10-14: 75 mL via INTRA_ARTERIAL

## 2015-10-14 MED ORDER — LIDOCAINE HCL (PF) 1 % IJ SOLN
INTRAMUSCULAR | Status: AC
  Filled 2015-10-14: qty 30

## 2015-10-14 MED ORDER — SODIUM CHLORIDE 0.9 % WEIGHT BASED INFUSION
3.0000 mL/kg/h | INTRAVENOUS | Status: AC
  Administered 2015-10-14: 3 mL/kg/h via INTRAVENOUS

## 2015-10-14 MED ORDER — HEPARIN SODIUM (PORCINE) 1000 UNIT/ML IJ SOLN
INTRAMUSCULAR | Status: DC | PRN
  Administered 2015-10-14: 5000 [IU] via INTRAVENOUS

## 2015-10-14 MED ORDER — FENTANYL CITRATE (PF) 100 MCG/2ML IJ SOLN
INTRAMUSCULAR | Status: DC | PRN
  Administered 2015-10-14: 25 ug via INTRAVENOUS
  Administered 2015-10-14: 50 ug via INTRAVENOUS

## 2015-10-14 MED ORDER — ONDANSETRON HCL 4 MG/2ML IJ SOLN: 4.0000 mg | Freq: Four times a day (QID) | INTRAMUSCULAR | Status: DC | PRN

## 2015-10-14 MED ORDER — FENTANYL CITRATE (PF) 100 MCG/2ML IJ SOLN
INTRAMUSCULAR | Status: AC
  Filled 2015-10-14: qty 2

## 2015-10-14 MED ORDER — ASPIRIN 81 MG PO CHEW: 81.0000 mg | CHEWABLE_TABLET | ORAL | Status: DC

## 2015-10-14 MED ORDER — VERAPAMIL HCL 2.5 MG/ML IV SOLN
INTRAVENOUS | Status: DC | PRN
  Administered 2015-10-14: 10 mL via INTRA_ARTERIAL

## 2015-10-14 MED ORDER — VERAPAMIL HCL 2.5 MG/ML IV SOLN
INTRAVENOUS | Status: AC
  Filled 2015-10-14: qty 2

## 2015-10-14 MED ORDER — SODIUM CHLORIDE 0.9% FLUSH: 3.0000 mL | INTRAVENOUS | Status: DC | PRN

## 2015-10-14 MED ORDER — HEPARIN SODIUM (PORCINE) 1000 UNIT/ML IJ SOLN
INTRAMUSCULAR | Status: AC
  Filled 2015-10-14: qty 1

## 2015-10-14 SURGICAL SUPPLY — 10 items
CATH INFINITI 5 FR JL3.5 (CATHETERS) ×2 IMPLANT
CATH INFINITI 5FR ANG PIGTAIL (CATHETERS) ×2 IMPLANT
CATH INFINITI JR4 5F (CATHETERS) ×2 IMPLANT
DEVICE RAD COMP TR BAND LRG (VASCULAR PRODUCTS) ×1 IMPLANT
GLIDESHEATH SLEND A-KIT 6F 22G (SHEATH) ×2 IMPLANT
KIT HEART LEFT (KITS) ×2 IMPLANT
PACK CARDIAC CATHETERIZATION (CUSTOM PROCEDURE TRAY) ×2 IMPLANT
TRANSDUCER W/STOPCOCK (MISCELLANEOUS) ×2 IMPLANT
TUBING CIL FLEX 10 FLL-RA (TUBING) ×2 IMPLANT
WIRE SAFE-T 1.5MM-J .035X260CM (WIRE) ×2 IMPLANT

## 2015-10-14 NOTE — Discharge Instructions (Signed)
Radial Site Care °Refer to this sheet in the next few weeks. These instructions provide you with information about caring for yourself after your procedure. Your health care provider may also give you more specific instructions. Your treatment has been planned according to current medical practices, but problems sometimes occur. Call your health care provider if you have any problems or questions after your procedure. °WHAT TO EXPECT AFTER THE PROCEDURE °After your procedure, it is typical to have the following: °· Bruising at the radial site that usually fades within 1-2 weeks. °· Blood collecting in the tissue (hematoma) that may be painful to the touch. It should usually decrease in size and tenderness within 1-2 weeks. °HOME CARE INSTRUCTIONS °· Take medicines only as directed by your health care provider. °· You may shower 24-48 hours after the procedure or as directed by your health care provider. Remove the bandage (dressing) and gently wash the site with plain soap and water. Pat the area dry with a clean towel. Do not rub the site, because this may cause bleeding. °· Do not take baths, swim, or use a hot tub until your health care provider approves. °· Check your insertion site every day for redness, swelling, or drainage. °· Do not apply powder or lotion to the site. °· Do not flex or bend the affected arm for 24 hours or as directed by your health care provider. °· Do not push or pull heavy objects with the affected arm for 24 hours or as directed by your health care provider. °· Do not lift over 10 lb (4.5 kg) for 5 days after your procedure or as directed by your health care provider. °· Ask your health care provider when it is okay to: °¨ Return to work or school. °¨ Resume usual physical activities or sports. °¨ Resume sexual activity. °· Do not drive home if you are discharged the same day as the procedure. Have someone else drive you. °· You may drive 24 hours after the procedure unless otherwise  instructed by your health care provider. °· Do not operate machinery or power tools for 24 hours after the procedure. °· If your procedure was done as an outpatient procedure, which means that you went home the same day as your procedure, a responsible adult should be with you for the first 24 hours after you arrive home. °· Keep all follow-up visits as directed by your health care provider. This is important. °SEEK MEDICAL CARE IF: °· You have a fever. °· You have chills. °· You have increased bleeding from the radial site. Hold pressure on the site. °SEEK IMMEDIATE MEDICAL CARE IF: °· You have unusual pain at the radial site. °· You have redness, warmth, or swelling at the radial site. °· You have drainage (other than a small amount of blood on the dressing) from the radial site. °· The radial site is bleeding, and the bleeding does not stop after 30 minutes of holding steady pressure on the site. °· Your arm or hand becomes pale, cool, tingly, or numb. °  °This information is not intended to replace advice given to you by your health care provider. Make sure you discuss any questions you have with your health care provider. °  °Document Released: 03/04/2010 Document Revised: 02/20/2014 Document Reviewed: 08/18/2013 °Elsevier Interactive Patient Education ©2016 [employer] ° °

## 2015-10-14 NOTE — Brief Op Note (Signed)
   BRIEF CATH REPORT  10/14/2015  1:31 PM  PATIENT:  Brett Chambers  75 y.o. male with history of CABG in the early 90s and now known occlusion of all his grafts. He has had significant stent work done in the ostial circumflex as well as bifurcations circumflex-OM 1 branch. The PCI of ostial circumflex was in 2014 with a Xience DES 3.0 mm x 15 mm stent in the proximal circumflex into OM 1 stent was a Xience DES 2.75 mm x 28 mm and the following AV groove circumflex stent was a 2.75 mm x 12 mm Xience DES. He was referred for now new-onset progressively worsening exertional dyspnea is concern for possible anginal equivalent.  PRE-OPERATIVE DIAGNOSIS:  cad of native and graft disease. Exertional dyspnea  POST-OPERATIVE DIAGNOSIS:  Known occlusion of SVG-RPDA, SVG-diagonal, SVG-OM.   Widely patent LIMA-LAD graft with 40-50% narrowing in the distal LAD. There is extensive collaterals to the PDA from the LAD is to the distal circumflex system.  Widely patent Circumflex-OM1 and AV groove Circumflex stents with 40% stenosis after the stent in the OM 1.  Preserved EF of roughly 50% with mild anterolateral hypokinesis.  LVEDP was 14   PROCEDURE:  Procedure(s): Left Heart Cath and Coronary Angiography (N/A); graft angiography  Right radial access with 6 French glide sheath using micropuncture access kit. 10 mL's of radial cocktail which is 2.5 mg of verapamil administered IA), heparin 5000 5000 units administered.  Left heart catheter physician:   Initially the wire was advanced using a JR4 catheter into the ascending aorta then exchanged for JL4 catheter to perform left coronary angiography.  JL4 catheter exchanged for JR4 catheter to cross the aortic valve and hand injection left ventricle atrophy. Unable to cross with angled pigtail.  JR4 catheter was then pulled back over wire into the right subclavian artery and used to engage the LIMA graft. Renal angiography performed.  Catheter removed  completely out of the body over wire  TR band applied 1330 hrs. at 15 mL air  SURGEON:  Surgeon(s) and Role:    * Leonie Man, MD - Primary  ANESTHESIA:   local and IV sedation; 3 mg Versed, 75 g Fentanyl  EBL:  No intake/output data recorded.  < 20 mL  TOURNIQUET: TR band applied 1330 hrs. at 15 mL air   DICTATION: .Note written in [place]: Discharge to home after PACU; would likely adjust antihypertensive agents. Check 2-D echocardiogram to evaluate for potential valvular disease  PATIENT DISPOSITION:  PACU - hemodynamically stable.   Delay start of Pharmacological VTE agent (>24hrs) due to surgical blood loss or risk of bleeding: no   [name], M.D., M.S. Interventional Cardiologist   Pager # [phone] Phone # [phone] [address] [place], [place] [address]

## 2015-10-14 NOTE — Interval H&P Note (Signed)
History and Physical Interval Note:  10/14/2015 12:43 PM  Brett Chambers  has presented today for cardiac catheterization, with the diagnosis of cad with progressive exertional dyspnea/angina.   The various methods of treatment have been discussed with the patient and family. After consideration of risks, benefits and other options for treatment, the patient has consented to  Procedure(s): Left Heart Cath and Coronary Angiography (N/A) graft angiography and possible per case coronary intervention as a surgical intervention .  The patient's history has been reviewed, patient examined, no change in status, stable for surgery.  I have reviewed the patient's chart and labs.  Questions were answered to the patient's satisfaction.    Cath Lab Visit (complete for each Cath Lab visit)  Clinical Evaluation Leading to the Procedure:   ACS: No.  Non-ACS:    Anginal Classification: CCS III  Anti-ischemic medical therapy: Minimal Therapy (1 class of medications)  Non-Invasive Test Results: No non-invasive testing performed  Prior CABG: Previous CABG    [name]

## 2015-10-14 NOTE — H&P (View-Only) (Signed)
Cardiology Office Note:    Date:  10/12/2015   ID:  Brett Chambers, DOB 1940-09-27, MRN [id number]  PCP:  [name], MD  Cardiologist:  Dr. Liam Rogers   Electrophysiologist:  n/a  Referring MD: [name], MD   Chief Complaint  Patient presents with  . Follow-up    CAD    History of Present Illness:    Brett Chambers is a 75 y.o. male with a hx of CAD, s/p CABG in 1994, HTN, HL. LHC in 08/2008 demonstrated 2/4 grafts patent with high grade disease at the bifurcation of the CFX and OM. He underwent PCI with a Xience DES x2. Relook catheterization 09/2008 demonstrated stable anatomy and patent LCx stents. In 7/14, the patient complained of chest discomfort. Stress testing was high risk with anterolateral ischemia. LHC in 7/14 demonstrated occlusion of the SVG-RCA and severe proximal LCx stenosis. He underwent FFR guided PCI with Xience DES to the ostial LCx. PCI of the extensive CTO of the RCA would be technically challenging and this was treated medically.  Last seen by me in 7/16. He returns for follow-up. He is here today with his wife. He complains of progressively worsening dyspnea with exertion over the past 2-3 months. He has had to stop walking secondary to this. His weight is up 16 pounds since he stopped walking. He denies orthopnea or PND. He does awaken sometimes at night feeling like he is breathing more rapid. He seems to hear a wheezing sound. He denies syncope. He has noted that his blood pressure is higher recently. He denies any chest discomfort. He has never really had classic anginal symptoms.  Prior CV studies that were reviewed today include:    Carotid US 6/17 < 40% bilateral ICA stenosis  Event monitor 8/14:    NSR, sinus brady, very frequent PACs and occasional atrial runs (3-4 beats). No significant pauses  LHC 08/29/12:  LM and oCFX junction 75% LAD ostial occluded CFX ostial 75-80%, CFX stents patent with 50% ISR at prox edge, 40% before stented  segment in mid CFX RCA proximal occluded S-OM occluded S-Dx occluded S-RCA occluded new RIMA-LAD patent EF 55%. FFR guided PCI:  Xience DES to the ostial CFX.  Myoview 08/2012 High risk stress nuclear study Marked ECG abnormalities Inferior wall infarct base and mid level with severe ischemia in moderate anterolateral sized area from apex to base.  Echo 9/09:  EF 55-60%, mild MR, mild TR, mild AI, mild Ao sclerosis.   Myoview 08/2008 Anterolateral ischemia, EF 57%.   Past Medical History:  Diagnosis Date  . Bradycardia    a. event monitor (8/14):  NSR, sinus brady, very frequent PACs and occasional atrial runs (3-4 beats).  No significant pauses.  . Clavicle fracture    left  . Coronary artery disease    a. s/p CABG 1994;  B. Abnl myoview 08/2008: Anterolateral ischemia, EF 57%;  LHC 7/10: Xience DES x2 to the bifurcation lesion of the circumflex and obtuse marginal. c. Abnl myoview 08/2012-> cath with interval occ of SVG-RCA, CTO of LAD/RCA, severe prox LCx stenosis s/p FFR-guided DES to LCx  . GERD (gastroesophageal reflux disease)   . Hyperlipidemia   . Hypertension    mild hypertension  . Thrombocytopenia ([place])    a. Noted on labs 08/2012.    Past Surgical History:  Procedure Laterality Date  . CARDIAC CATHETERIZATION     His most recent in August of 2010 showed totally occluded LAD,  . COLONOSCOPY    .  CORONARY ANGIOPLASTY    . CORONARY ARTERY BYPASS GRAFT  1993   RIMA-LAD, SVG-1ST, 2ND DIAG, SVG-1ST, 2 ND OM, PD  . CORONARY STENT PLACEMENT  7/10   Stent- Left Cirx  . FRACTIONAL FLOW RESERVE WIRE N/A 08/29/2012   Procedure: FRACTIONAL FLOW RESERVE WIRE;  Surgeon: Sherren Mocha, MD;  Location: [facility] CATH LAB;  Service: Cardiovascular;  Laterality: N/A;  . FRACTURE SURGERY     right  . ORIF CLAVICULAR FRACTURE Left 05/21/2014   Procedure: OPEN REDUCTION INTERNAL FIXATION (ORIF) CLAVICULAR FRACTURE;  Surgeon: Tania Ade, MD;  Location: [facility];  Service: Orthopedics;   Laterality: Left;  Open reduction internal fixation clavical fracture  . US ECHOCARDIOGRAPHY  10-29-2007   EF 55-60%    Current Medications: Outpatient Medications Prior to Visit  Medication Sig Dispense Refill  . aspirin EC 81 MG tablet Take 81 mg by mouth daily.    . clopidogrel (PLAVIX) 75 MG tablet TAKE ONE TABLET BY MOUTH ONCE DAILY 30 tablet 0  . lisinopril (PRINIVIL,ZESTRIL) 5 MG tablet Take 1 tablet (5 mg total) by mouth daily. (Patient taking differently: Take 5 mg by mouth daily with breakfast. ) 90 tablet 3  . isosorbide mononitrate (IMDUR) 30 MG 24 hr tablet TAKE ONE-HALF TABLET BY MOUTH ONCE DAILY 90 tablet 3  . nitroGLYCERIN (NITROSTAT) 0.4 MG SL tablet Place 1 tablet (0.4 mg total) under the tongue every 5 (five) minutes as needed for chest pain. 25 tablet 3  . rosuvastatin (CRESTOR) 20 MG tablet TAKE ONE TABLET BY MOUTH ONCE DAILY 90 tablet 3  . isosorbide mononitrate (IMDUR) 30 MG 24 hr tablet TAKE ONE-HALF TABLET BY MOUTH ONCE DAILY (Patient not taking: Reported on 10/12/2015) 15 tablet 0   No facility-administered medications prior to visit.       Allergies:   Lipitor [atorvastatin]   Social History   Social History  . Marital status: Married    Spouse name: N/A  . Number of children: N/A  . Years of education: N/A   Social History Main Topics  . Smoking status: Former Smoker    Quit date: 02/14/1963  . Smokeless tobacco: Never Used  . Alcohol use No  . Drug use: No  . Sexual activity: Not Asked   Other Topics Concern  . None   Social History Narrative  . None     Family History:  The patient's family history includes Heart disease in his brother; Hypertension in his other; Stroke in his other.   ROS:   Please see the history of present illness.    Review of Systems  Cardiovascular: Positive for irregular heartbeat.  Respiratory: Positive for cough, shortness of breath and wheezing.   Hematologic/Lymphatic: Bruises/bleeds easily.   All other systems  reviewed and are negative.   EKGs/Labs/Other Test Reviewed:    EKG:  EKG is  ordered today.  The ekg ordered today demonstrates Sinus bradycardia, HR 59, normal axis, RSR prime V1-V2, QTc 443 ms, nonspecific ST-T wave changes, no significant change since prior tracing dated 08/28/14  Recent Labs: 10/12/2015: Brain Natriuretic Peptide 299.8; BUN 10; Creat 1.26; Potassium 3.8; Sodium 142   Labs from PCP 5/17: LDL 71, serum creatinine 1.1, TSH 4.84, ALT 15, hemoglobin 14.8  Recent Lipid Panel    Component Value Date/Time   CHOL 114 07/09/2012 0745   TRIG 94.0 07/09/2012 0745   HDL 39.70 07/09/2012 0745   CHOLHDL 3 07/09/2012 0745   VLDL 18.8 07/09/2012 0745   LDLCALC 56 07/09/2012 0745  Physical Exam:    VS:  BP (!) 170/80   Pulse 60   Ht 6\' 1"  (1.854 m)   Wt 207 lb 12.8 oz (94.3 kg)   SpO2 97%   BMI 27.42 kg/m     Wt Readings from Last 3 Encounters:  10/12/15 207 lb 12.8 oz (94.3 kg)  08/28/14 191 lb (86.6 kg)  05/21/14 190 lb (86.2 kg)     Physical Exam  Constitutional: He is oriented to person, place, and time. He appears well-developed and well-nourished. No distress.  HENT:  Head: Normocephalic and atraumatic.  Eyes: No scleral icterus.  Neck: Normal range of motion. No JVD present.  Cardiovascular: Normal rate, regular rhythm, S1 normal and S2 normal.  Exam reveals no gallop and no friction rub.   No murmur heard. Pulmonary/Chest: Effort normal. He has decreased breath sounds. He has no wheezes. He has no rhonchi. He has no rales.  Abdominal: Soft. There is no tenderness.  Musculoskeletal: He exhibits no edema.  Neurological: He is alert and oriented to person, place, and time.  Skin: Skin is warm and dry.  Psychiatric: He has a normal mood and affect.    ASSESSMENT:    1. Dyspnea   2. Coronary artery disease involving native coronary artery of native heart without angina pectoris   3. Essential hypertension   4. Hypercholesterolemia    PLAN:    In  order of problems listed above:  1. Dyspnea - I suspect that his dyspnea is likely an anginal equivalent. He has significant disease as outlined above on cardiac catheterization in 2014. I reviewed his case today with Dr. [name]. We feel that nuclear stress testing would likely not be helpful given his known disease. Therefore, we recommend proceeding with cardiac catheterization to further evaluate.  Risks and benefits of cardiac catheterization have been discussed with the patient.  These include bleeding, infection, kidney damage, stroke, heart attack, death.  The patient understands these risks and is willing to proceed.   -  Labs today:  BMET, BNP, CBC, INR.  -  Increase Imdur to 30 mg QD  -  Proceed with LHC next 1-2 weeks.   2. Coronary artery disease - s/p CABG in 1994, DES 2 to the LCx/OM bifurcation in 2010 and DES 1 to the ostial LCx in 2014.   By last catheterization, 3 out of 4 grafts were occluded. He has a chronic total occlusion of the RCA that was treated medically. As noted, he has progressively worsening dyspnea on exertion that is likely angina. Cardiac catheterization will be arranged. I will increase his isosorbide as outlined above. Continue ASA, Plavix, statin, ACE inhibitor.  3. Essential hypertension - BP elevated. Increase Isosorbide as noted.   4. Hyperlipidemia - LDL in 5/17 was 71. Continue statin.   Medication Adjustments/Labs and Tests Ordered: Current medicines are reviewed at length with the patient today.  Concerns regarding medicines are outlined above.  Medication changes, Labs and Tests ordered today are outlined in the Patient Instructions noted below. Patient Instructions  Medication Instructions:  1. INCREASE IMDUR TO 30 MG DAILY; NEW RX SENT TODAY 2. YOU HAVE BEEN GIVEN AN [facility] Labwork: 1. TODAY BMET, CBC W/DIFF, PT/INR, BNP Testing/Procedures: 1. Your physician has requested that you have a cardiac catheterization.  Cardiac catheterization is used to diagnose and/or treat various heart conditions. Doctors may recommend this procedure for a number of different reasons. The most common reason is to evaluate chest  pain. Chest pain can be a symptom of coronary artery disease (CAD), and cardiac catheterization can show whether plaque is narrowing or blocking your heart's arteries. This procedure is also used to evaluate the valves, as well as measure the blood flow and oxygen levels in different parts of your heart. For further information please visit [email or web address]. Please follow instruction sheet, as given. CATH IS ON 10/14/15 2. Your physician has requested that you have an echocardiogram. Echocardiography is a painless test that uses sound waves to create images of your heart. It provides your doctor with information about the size and shape of your heart and how well your heart's chambers and valves are working. This procedure takes approximately one hour. There are no restrictions for this procedure. HAVE ECHO DONE SOMETIME BEFORE YOUR FOLLOW UP APPT WITH Yama Nielson, PAC  Follow-Up: Hassaan Crite, PAC 2 WEEKS POST CATH  Any Other Special Instructions Will Be Listed Below (If Applicable). If you need a refill on your cardiac medications before your next appointment, please call your pharmacy.  Signed, [name], PA-C  10/12/2015 5:15 PM    [facility] [place], [place], [place]  [address] Phone: [phone]; Fax: [phone]

## 2015-10-15 ENCOUNTER — Encounter (HOSPITAL_COMMUNITY): Payer: Self-pay | Admitting: Cardiology

## 2015-10-22 ENCOUNTER — Ambulatory Visit (HOSPITAL_COMMUNITY): Payer: Medicare HMO | Attending: Cardiovascular Disease

## 2015-10-22 ENCOUNTER — Telehealth: Payer: Self-pay | Admitting: *Deleted

## 2015-10-22 ENCOUNTER — Other Ambulatory Visit: Payer: Self-pay

## 2015-10-22 ENCOUNTER — Encounter: Payer: Self-pay | Admitting: Physician Assistant

## 2015-10-22 DIAGNOSIS — I351 Nonrheumatic aortic (valve) insufficiency: Secondary | ICD-10-CM | POA: Diagnosis not present

## 2015-10-22 DIAGNOSIS — I2510 Atherosclerotic heart disease of native coronary artery without angina pectoris: Secondary | ICD-10-CM | POA: Diagnosis not present

## 2015-10-22 DIAGNOSIS — I119 Hypertensive heart disease without heart failure: Secondary | ICD-10-CM | POA: Diagnosis not present

## 2015-10-22 DIAGNOSIS — E785 Hyperlipidemia, unspecified: Secondary | ICD-10-CM | POA: Diagnosis not present

## 2015-10-22 DIAGNOSIS — R0600 Dyspnea, unspecified: Secondary | ICD-10-CM | POA: Diagnosis not present

## 2015-10-22 DIAGNOSIS — I255 Ischemic cardiomyopathy: Secondary | ICD-10-CM | POA: Insufficient documentation

## 2015-10-22 NOTE — Telephone Encounter (Signed)
Pt notified of echo results and findings by phone with verbal understanding. Pt aware to continue on current Tx plan and keep f/u with PA to discuss results further. Pt ok with results faxed to PCP.

## 2015-10-23 ENCOUNTER — Other Ambulatory Visit: Payer: Self-pay | Admitting: Physician Assistant

## 2015-10-28 NOTE — Progress Notes (Signed)
Cardiology Office Note:    Date:  10/29/2015   ID:  Brett Chambers, DOB 1940/11/24, MRN [id number]  PCP:  [name], MD  Cardiologist:  Dr. Liam Rogers   Electrophysiologist:  n/a  Referring MD: [name], MD   Chief Complaint  Patient presents with  . Follow-up    Dyspnea, CAD, s/p cath   History of Present Illness:    Brett Chambers is a 75 y.o. male with a hx of CAD, s/p CABG in 1994, HTN, HL. LHC in 08/2008 demonstrated 2/4 grafts patent with high grade disease at the bifurcation of the CFX and OM. He underwent PCI with a Xience DES x2. Relook catheterization 09/2008 demonstrated stable anatomy and patent LCx stents. In 7/14, the patient complained of chest discomfort. Stress testing was high risk with anterolateral ischemia. LHC in 7/14 demonstrated occlusion of the SVG-RCA and severe proximal LCx stenosis. He underwent FFR guided PCI with Xience DES to the ostial LCx. PCI of the extensive CTO of the RCA would be technically challenging and this was treated medically.  I saw him 10/12/15 with complaints of progressively worsening dyspnea. Cardiac catheterization was arranged. This demonstrated patent left main, LCx and OM stents as well as patent RIMA-LAD. SVG-RCA was known to be occluded. Anatomy was felt to be stable. EF was noted to be 45-50%. Medical therapy was continued. Echocardiogram was obtained to rule out valvular disease. This demonstrated EF 40-45% with anteroseptal hypokinesis, mild aortic insufficiency and moderate diastolic dysfunction. He returns for follow-up post catheterization.   He returns for follow-up. He is doing well. Breathing is back to normal. He denies orthopnea or PND. Denies syncope. Denies any chest discomfort. His blood pressure does drop low sometimes during the day. He is symptomatic with this. He decided to change his isosorbide 15 mg twice a day secondary to headache.  Prior CV studies that were reviewed today include:    Echo 10/22/15 EF  40-45%, ant-septal HK, Gr 2 DD, mild AI, mild LAE  LHC 10/14/15 LM - stent patent LAD ost 100% CTO, dist 45% LCx ostial /prox stent patent with 5% ISR, mid stent patent, OM1 stent patent with 5% ISR, then 30% RCA - ostial 100% CTO with L-R collaterals to RPDA S-RCA 100% RIMA-LAD ok EF 45-50%  Angiographically similar coronary disease when compared to post PCI imaging from last catheterization. No new lesion to explain the patient's new onset symptoms. PLAN OF CARE: Discharge to home after PACU; would likely adjust antihypertensive agents. Check 2-D echocardiogram to evaluate for potential valvular disease  Carotid US 6/17 < 40% bilateral ICA stenosis  Event monitor 8/14:  NSR, sinus brady, very frequent PACs and occasional atrial runs (3-4 beats). No significant pauses  LHC 08/29/12:  LM and oCFX junction 75% LAD ostial occluded CFX ostial 75-80%, CFX stents patent with 50% ISR at prox edge, 40% before stented segment in mid CFX RCA proximal occluded S-OM occluded S-Dx occluded S-RCA occluded new RIMA-LAD patent EF 55%. FFR guided PCI:  Xience DES to the ostial CFX.  Myoview 08/2012 High risk stress nuclear study Marked ECG abnormalities Inferior wall infarct base and mid level with severe ischemia in moderate anterolateral sized area from apex to base.  Echo 9/09:  EF 55-60%, mild MR, mild TR, mild AI, mild Ao sclerosis.   Myoview 08/2008 Anterolateral ischemia, EF 57%.   Past Medical History:  Diagnosis Date  . Bradycardia    a. event monitor (8/14):  NSR, sinus brady, very frequent PACs  and occasional atrial runs (3-4 beats).  No significant pauses.  . Clavicle fracture    left  . Coronary artery disease    a. s/p CABG 1994;  B. Abnl myoview 08/2008: Anterolateral ischemia, EF 57%;  LHC 7/10: Xience DES x2 to the bifurcation lesion of the circumflex and obtuse marginal. c. Abnl myoview 08/2012-> cath with interval occ of SVG-RCA, CTO of LAD/RCA, severe prox LCx  stenosis s/p FFR-guided DES to LCx  . GERD (gastroesophageal reflux disease)   . Hyperlipidemia   . Hypertension    mild hypertension  . Ischemic cardiomyopathy    a. Echo 9/17: EF 40-45%, anteroseptal HK, grade 2 diastolic dysfunction, mild AI, mild LAE  . Thrombocytopenia ([place])    a. Noted on labs 08/2012.    Past Surgical History:  Procedure Laterality Date  . CARDIAC CATHETERIZATION     His most recent in August of 2010 showed totally occluded LAD,  . CARDIAC CATHETERIZATION N/A 10/14/2015   Procedure: Left Heart Cath and Coronary Angiography;  Surgeon: Leonie Man, MD;  Location: [facility];  Service: Cardiovascular;  Laterality: N/A;  . COLONOSCOPY    . CORONARY ANGIOPLASTY    . CORONARY ARTERY BYPASS GRAFT  1993   RIMA-LAD, SVG-1ST, 2ND DIAG, SVG-1ST, 2 ND OM, PD  . CORONARY STENT PLACEMENT  7/10   Stent- Left Cirx  . FRACTIONAL FLOW RESERVE WIRE N/A 08/29/2012   Procedure: FRACTIONAL FLOW RESERVE WIRE;  Surgeon: Sherren Mocha, MD;  Location: [facility] CATH LAB;  Service: Cardiovascular;  Laterality: N/A;  . FRACTURE SURGERY     right  . ORIF CLAVICULAR FRACTURE Left 05/21/2014   Procedure: OPEN REDUCTION INTERNAL FIXATION (ORIF) CLAVICULAR FRACTURE;  Surgeon: Tania Ade, MD;  Location: [facility];  Service: Orthopedics;  Laterality: Left;  Open reduction internal fixation clavical fracture  . US ECHOCARDIOGRAPHY  10-29-2007   EF 55-60%    Current Medications: Outpatient Medications Prior to Visit  Medication Sig Dispense Refill  . aspirin EC 81 MG tablet Take 81 mg by mouth daily.    . clopidogrel (PLAVIX) 75 MG tablet TAKE ONE TABLET BY MOUTH ONCE DAILY. MUST HAVE OFFICE VISIT BEFORE FURTHER REFILLS. 30 tablet 11  . famotidine (PEPCID) 20 MG tablet Take 20 mg by mouth daily as needed for heartburn.     . nitroGLYCERIN (NITROSTAT) 0.4 MG SL tablet Place 1 tablet (0.4 mg total) under the tongue every 5 (five) minutes as needed for chest pain. 25 tablet 3  . isosorbide  mononitrate (IMDUR) 30 MG 24 hr tablet Take 1 tablet (30 mg total) by mouth daily. 90 tablet 3  . lisinopril (PRINIVIL,ZESTRIL) 5 MG tablet Take 1 tablet (5 mg total) by mouth daily. (Patient taking differently: Take 5 mg by mouth daily with breakfast. ) 90 tablet 3  . rosuvastatin (CRESTOR) 20 MG tablet Take 1 tablet (20 mg total) by mouth daily. (Patient taking differently: Take 20 mg by mouth daily with breakfast. ) 90 tablet 3   No facility-administered medications prior to visit.       Allergies:   Lipitor [atorvastatin]   Social History   Social History  . Marital status: Married    Spouse name: N/A  . Number of children: N/A  . Years of education: N/A   Social History Main Topics  . Smoking status: Former Smoker    Quit date: 02/14/1963  . Smokeless tobacco: Never Used  . Alcohol use No  . Drug use: No  . Sexual activity:  Not Asked   Other Topics Concern  . None   Social History Narrative  . None     Family History:  The patient's family history includes Heart disease in his brother; Hypertension in his other; Stroke in his other.   ROS:   Please see the history of present illness.    Review of Systems  HENT: Positive for hearing loss.   Cardiovascular: Positive for irregular heartbeat.  Respiratory: Positive for shortness of breath.   Hematologic/Lymphatic: Bruises/bleeds easily.   All other systems reviewed and are negative.   EKGs/Labs/Other Test Reviewed:    EKG:  EKG is  ordered today.  The ekg ordered today demonstrates NSR, HR 68, normal axis, PACs, QTc 429 ms, no changes  Recent Labs: 10/12/2015: Brain Natriuretic Peptide 299.8; BUN 10; Creat 1.26; Hemoglobin 13.3; Platelets 121; Potassium 3.8; Sodium 142   Recent Lipid Panel    Component Value Date/Time   CHOL 114 07/09/2012 0745   TRIG 94.0 07/09/2012 0745   HDL 39.70 07/09/2012 0745   CHOLHDL 3 07/09/2012 0745   VLDL 18.8 07/09/2012 0745   LDLCALC 56 07/09/2012 0745     Physical Exam:     VS:  BP (!) 150/82   Pulse 68   Ht 6\' 1"  (1.854 m)   Wt 193 lb 12.8 oz (87.9 kg)   BMI 25.57 kg/m     Wt Readings from Last 3 Encounters:  10/29/15 193 lb 12.8 oz (87.9 kg)  10/14/15 205 lb (93 kg)  10/12/15 207 lb 12.8 oz (94.3 kg)     Physical Exam  Constitutional: He is oriented to person, place, and time. He appears well-developed and well-nourished. No distress.  HENT:  Head: Normocephalic and atraumatic.  Eyes: No scleral icterus.  Neck: No JVD present.  Cardiovascular: Normal rate, regular rhythm and normal heart sounds.   No murmur heard. Pulmonary/Chest: Effort normal. He has no wheezes. He has no rales.  Abdominal: Soft. There is no tenderness.  Musculoskeletal: He exhibits no edema.  right wrist without hematoma or mass   Neurological: He is alert and oriented to person, place, and time.  Skin: Skin is warm and dry.  Psychiatric: He has a normal mood and affect.    ASSESSMENT:    1. Dyspnea   2. Ischemic cardiomyopathy   3. Coronary artery disease involving native coronary artery of native heart with angina pectoris ([place])   4. Essential hypertension   5. Hyperlipidemia with target LDL less than 70    PLAN:    In order of problems listed above:  1. Dyspnea - Improved.Recent LHC with stable anatomy.  EF is now 40-45% by echo and there are no significant valve issues.  LVEDP at the time of his LHC was 14 mmHg.  He is back to walking and feels better.  He does not appear volume overloaded.  He has systolic and diastolic dysfunction.  We discussed the importance of daily weights.  I will give him Lasix 20 mg to take QD prn for weight gain, edema or increased dyspnea.   2. Ischemic CM - EF now 40-45%.  He cannot tolerate beta blockers due to bradycardia.  Continue ACE, nitrates.  3. Coronary artery disease - s/p CABG in 1994, DES 2 to the LCx/OM bifurcation in 2010 and DES 1 to the ostial LCx in 2014.  He has 3 out of 4 grafts known to be occluded.  Recent  LHC with stable anatomy and patent stents.  Continue medical Rx. Continue  ASA, Plavix, statin, ACE inhibitor, nitrates.  4. HTN - BP elevated before meds in AM and drops low sometimes in the PM.  He has been symptomatic with this.  Will change Imdur back to 15 mg QD.  I have asked him to change Lisinopril to QPM as well.   5. HL - LDL in 5/17 was 71. Continue statin.    Medication Adjustments/Labs and Tests Ordered: Current medicines are reviewed at length with the patient today.  Concerns regarding medicines are outlined above.  Medication changes, Labs and Tests ordered today are outlined in the Patient Instructions noted below. Patient Instructions  Medication Instructions:  1. CHANGE LISINOPRIL TO TAKE IN THE EVENING 2.DECREASE IMDUR TO 15 MG DAILY 3. START LASIX  20 MG DAILY ONLY AS NEEDED IF YOUR WEIGHT IS UP 3 LB'S OR MORE IN 1 DAY, SEE INCREASED SWELLING OR SHORTNESS OF BREATH Labwork: NONE Testing/Procedures: NONE Follow-Up: Your physician wants you to follow-up in: [place], [facility]  You will receive a reminder letter in the mail two months in advance. If you don't receive a letter, please call our office to schedule the follow-up appointment.  Any Other Special Instructions Will Be Listed Below (If Applicable). If you need a refill on your cardiac medications before your next appointment, please call your pharmacy.   Signed, [name], PA-C  10/29/2015 2:33 PM    [facility] [place], [place], [place]  [address] Phone: [phone]; Fax: [phone]

## 2015-10-29 ENCOUNTER — Ambulatory Visit (INDEPENDENT_AMBULATORY_CARE_PROVIDER_SITE_OTHER): Payer: Medicare HMO | Admitting: Physician Assistant

## 2015-10-29 ENCOUNTER — Encounter: Payer: Self-pay | Admitting: Physician Assistant

## 2015-10-29 VITALS — BP 150/82 | HR 68 | Ht 73.0 in | Wt 193.8 lb

## 2015-10-29 DIAGNOSIS — I25119 Atherosclerotic heart disease of native coronary artery with unspecified angina pectoris: Secondary | ICD-10-CM | POA: Diagnosis not present

## 2015-10-29 DIAGNOSIS — I255 Ischemic cardiomyopathy: Secondary | ICD-10-CM

## 2015-10-29 DIAGNOSIS — I10 Essential (primary) hypertension: Secondary | ICD-10-CM | POA: Diagnosis not present

## 2015-10-29 DIAGNOSIS — R0600 Dyspnea, unspecified: Secondary | ICD-10-CM

## 2015-10-29 DIAGNOSIS — E785 Hyperlipidemia, unspecified: Secondary | ICD-10-CM

## 2015-10-29 MED ORDER — ISOSORBIDE MONONITRATE ER 30 MG PO TB24: 15.0000 mg | ORAL_TABLET | Freq: Every day | ORAL | Status: DC

## 2015-10-29 MED ORDER — FUROSEMIDE 20 MG PO TABS: 20.0000 mg | ORAL_TABLET | Freq: Every day | ORAL | 3 refills | Status: DC | PRN

## 2015-10-29 NOTE — Patient Instructions (Addendum)
Medication Instructions:  1. CHANGE LISINOPRIL TO TAKE IN THE EVENING 2.DECREASE IMDUR TO 15 MG DAILY 3. START LASIX  20 MG DAILY ONLY AS NEEDED IF YOUR WEIGHT IS UP 3 LB'S OR MORE IN 1 DAY, SEE INCREASED SWELLING OR SHORTNESS OF BREATH Labwork: NONE Testing/Procedures: NONE Follow-Up: Your physician wants you to follow-up in: [place], [facility]  You will receive a reminder letter in the mail two months in advance. If you don't receive a letter, please call our office to schedule the follow-up appointment.  Any Other Special Instructions Will Be Listed Below (If Applicable). If you need a refill on your cardiac medications before your next appointment, please call your pharmacy.

## 2015-11-16 ENCOUNTER — Other Ambulatory Visit: Payer: Self-pay | Admitting: Cardiovascular Disease

## 2015-11-19 DIAGNOSIS — D123 Benign neoplasm of transverse colon: Secondary | ICD-10-CM | POA: Diagnosis not present

## 2015-11-19 DIAGNOSIS — D126 Benign neoplasm of colon, unspecified: Secondary | ICD-10-CM | POA: Diagnosis not present

## 2015-11-19 DIAGNOSIS — K5730 Diverticulosis of large intestine without perforation or abscess without bleeding: Secondary | ICD-10-CM | POA: Diagnosis not present

## 2015-11-19 DIAGNOSIS — Z1211 Encounter for screening for malignant neoplasm of colon: Secondary | ICD-10-CM | POA: Diagnosis not present

## 2016-04-26 ENCOUNTER — Ambulatory Visit (INDEPENDENT_AMBULATORY_CARE_PROVIDER_SITE_OTHER): Payer: Medicare HMO | Admitting: Physician Assistant

## 2016-04-26 ENCOUNTER — Encounter: Payer: Self-pay | Admitting: Physician Assistant

## 2016-04-26 VITALS — BP 112/70 | HR 60 | Ht 73.0 in | Wt 193.4 lb

## 2016-04-26 DIAGNOSIS — I25119 Atherosclerotic heart disease of native coronary artery with unspecified angina pectoris: Secondary | ICD-10-CM

## 2016-04-26 DIAGNOSIS — I255 Ischemic cardiomyopathy: Secondary | ICD-10-CM | POA: Diagnosis not present

## 2016-04-26 DIAGNOSIS — E785 Hyperlipidemia, unspecified: Secondary | ICD-10-CM

## 2016-04-26 DIAGNOSIS — I10 Essential (primary) hypertension: Secondary | ICD-10-CM | POA: Diagnosis not present

## 2016-04-26 MED ORDER — CLOPIDOGREL BISULFATE 75 MG PO TABS: ORAL_TABLET | ORAL | 3 refills | Status: DC

## 2016-04-26 MED ORDER — ROSUVASTATIN CALCIUM 5 MG PO TABS: 5.0000 mg | ORAL_TABLET | Freq: Every day | ORAL | 3 refills | Status: DC

## 2016-04-26 NOTE — Patient Instructions (Addendum)
Medication Instructions:  You have been given two prescription refills on paper. Crestor and Plavix for 90 days.  Labwork: None ordered  Testing/Procedures: None ordered   Follow-Up: Your physician wants you to follow-up in: 6 months with [name] PA-C or Dr. [name]. You will receive a reminder letter in the mail two months in advance. If you don't receive a letter, please call our office to schedule the follow-up appointment.  Any Other Special Instructions Will Be Listed Below (If Applicable).  If you need a refill on your cardiac medications before your next appointment, please call your pharmacy.

## 2016-04-26 NOTE — Progress Notes (Signed)
Cardiology Office Note:    Date:  04/26/2016   ID:  Brett Chambers, DOB 1940/09/07, MRN [id number]  PCP:  [name], MD  Cardiologist:  Dr. Liam Rogers   Electrophysiologist:  n/a  Referring MD: [name], MD   Chief Complaint  Patient presents with  . Follow-up    CAD    History of Present Illness:    Brett Chambers is a 76 y.o. male with a hx of CAD, s/p CABG in 1994, HTN, HL. LHC in 08/2008 demonstrated 2/4 grafts patent with high grade disease at the bifurcation of the CFX and OM. He underwent PCIwith a Xience DES x2. Relook catheterization 09/2008 demonstrated stable anatomy and patent LCx stents. In 7/14, the patient complained of chest discomfort. Stress testing was high risk with anterolateral ischemia. LHC in 7/14 demonstrated occlusion of the SVG-RCA and severe proximal LCx stenosis. He underwent FFR guided PCI with Xience DES to the ostial LCx. PCI of the extensive CTO of the RCA would be technically challenging and this was treated medically.  In 8/17, he developed progressively worsening dyspnea on exertion and LHC demonstrated patent left main, LCx and OM stents as well as patent RIMA-LAD. SVG-RCA was known to be occluded. EF was 45-50%. Medical therapy was continued. Echocardiogram demonstrated EF 40-45% with anteroseptal hypokinesis, mild aortic insufficiency and moderate diastolic dysfunction.   He was last seen in 9/17.  He returns for Cardiology follow up today.  He is here with his wife.  Since last seen, he has done very well.  He denies chest pain, shortness of breath, syncope, orthopnea, PND, edema.  He remains active around his house.    Prior CV studies:   The following studies were reviewed today:  Echo 10/22/15 EF 40-45%, ant-septal HK, Gr 2 DD, mild AI, mild LAE  LHC 10/14/15 LM - stent patent LAD ost 100% CTO, dist 45% LCx ostial /prox stent patent with 5% ISR, mid stent patent, OM1 stent patent with 5% ISR, then 30% RCA - ostial 100% CTO with L-R  collaterals to RPDA S-RCA 100% RIMA-LAD ok EF 45-50% Angiographically similar coronary disease when compared to post PCI imaging from last catheterization. No new lesion to explain the patient's new onset symptoms. PLAN OF CARE: Discharge to home after PACU; would likely adjust antihypertensive agents. Check 2-D echocardiogram to evaluate for potential valvular disease  Carotid US 6/17 < 40% bilateral ICA stenosis  Event monitor 8/14: NSR, sinus brady, very frequent PACs and occasional atrial runs (3-4 beats). No significant pauses  LHC 08/29/12: LM and oCFX junction 75% LAD ostial occluded CFX ostial 75-80%, CFX stents patent with 50% ISR at prox edge, 40% before stented segment in mid CFX RCA proximal occluded S-OM occluded S-Dx occluded S-RCA occluded new RIMA-LAD patent EF 55%. FFR guided PCI: Xience DES to the ostial CFX.  Myoview 08/2012 High risk stress nuclear study Marked ECG abnormalities Inferior wall infarct base and mid level with severe ischemia in moderate anterolateral sized area from apex to base.  Echo 9/09:  EF 55-60%, mild MR, mild TR, mild AI, mild Ao sclerosis.   Myoview 08/2008 Anterolateral ischemia, EF 57%.   Past Medical History:  Diagnosis Date  . Bradycardia    a. event monitor (8/14):  NSR, sinus brady, very frequent PACs and occasional atrial runs (3-4 beats).  No significant pauses.  . Clavicle fracture    left  . Coronary artery disease    a. s/p CABG 1994;  B. Abnl myoview 08/2008:  Anterolateral ischemia, EF 57%;  LHC 7/10: Xience DES x2 to the bifurcation lesion of the circumflex and obtuse marginal. c. Abnl myoview 08/2012-> cath with interval occ of SVG-RCA, CTO of LAD/RCA, severe prox LCx stenosis s/p FFR-guided DES to LCx  . GERD (gastroesophageal reflux disease)   . Hyperlipidemia   . Hypertension    mild hypertension  . Ischemic cardiomyopathy    a. Echo 9/17: EF 40-45%, anteroseptal HK, grade 2 diastolic dysfunction, mild  AI, mild LAE  . Thrombocytopenia ([place])    a. Noted on labs 08/2012.    Past Surgical History:  Procedure Laterality Date  . CARDIAC CATHETERIZATION     His most recent in August of 2010 showed totally occluded LAD,  . CARDIAC CATHETERIZATION N/A 10/14/2015   Procedure: Left Heart Cath and Coronary Angiography;  Surgeon: Leonie Man, MD;  Location: [facility];  Service: Cardiovascular;  Laterality: N/A;  . COLONOSCOPY    . CORONARY ANGIOPLASTY    . CORONARY ARTERY BYPASS GRAFT  1993   RIMA-LAD, SVG-1ST, 2ND DIAG, SVG-1ST, 2 ND OM, PD  . CORONARY STENT PLACEMENT  7/10   Stent- Left Cirx  . FRACTIONAL FLOW RESERVE WIRE N/A 08/29/2012   Procedure: FRACTIONAL FLOW RESERVE WIRE;  Surgeon: Sherren Mocha, MD;  Location: [facility] CATH LAB;  Service: Cardiovascular;  Laterality: N/A;  . FRACTURE SURGERY     right  . ORIF CLAVICULAR FRACTURE Left 05/21/2014   Procedure: OPEN REDUCTION INTERNAL FIXATION (ORIF) CLAVICULAR FRACTURE;  Surgeon: Tania Ade, MD;  Location: [facility];  Service: Orthopedics;  Laterality: Left;  Open reduction internal fixation clavical fracture  . US ECHOCARDIOGRAPHY  10-29-2007   EF 55-60%    Current Medications: Current Meds  Medication Sig  . aspirin EC 81 MG tablet Take 81 mg by mouth daily.  . clopidogrel (PLAVIX) 75 MG tablet TAKE ONE TABLET BY MOUTH ONCE DAILY.  . isosorbide mononitrate (IMDUR) 30 MG 24 hr tablet TAKE 0.5 TABLETS (15 MG TOTAL) BY MOUTH DAILY  . lisinopril (PRINIVIL,ZESTRIL) 5 MG tablet Take 5 mg by mouth every evening.  . nitroGLYCERIN (NITROSTAT) 0.4 MG SL tablet Place 1 tablet (0.4 mg total) under the tongue every 5 (five) minutes as needed for chest pain.  . rosuvastatin (CRESTOR) 5 MG tablet Take 1 tablet (5 mg total) by mouth daily.  . [DISCONTINUED] clopidogrel (PLAVIX) 75 MG tablet TAKE ONE TABLET BY MOUTH ONCE DAILY. MUST HAVE OFFICE VISIT BEFORE FURTHER REFILLS.  . [DISCONTINUED] rosuvastatin (CRESTOR) 5 MG tablet Take 5 mg by  mouth daily.  . [DISCONTINUED] rosuvastatin (CRESTOR) 5 MG tablet Take 1 tablet (5 mg total) by mouth daily.     Allergies:   Lipitor [atorvastatin]   Social History   Social History  . Marital status: Married    Spouse name: N/A  . Number of children: N/A  . Years of education: N/A   Social History Main Topics  . Smoking status: Former Smoker    Quit date: 02/14/1963  . Smokeless tobacco: Never Used  . Alcohol use No  . Drug use: No  . Sexual activity: Not Asked   Other Topics Concern  . None   Social History Narrative  . None     Family History  Problem Relation Age of Onset  . Heart disease Brother   . Stroke Other   . Hypertension Other      ROS:   Please see the history of present illness.    ROS All other systems  reviewed and are negative.   EKGs/Labs/Other Test Reviewed:    EKG:  EKG is  ordered today.  The ekg ordered today demonstrates NSR, HR 60, normal axis, QTc 418 ms no changes.  Recent Labs: 10/12/2015: Brain Natriuretic Peptide 299.8; BUN 10; Creat 1.26; Hemoglobin 13.3; Platelets 121; Potassium 3.8; Sodium 142   Recent Lipid Panel    Component Value Date/Time   CHOL 114 07/09/2012 0745   TRIG 94.0 07/09/2012 0745   HDL 39.70 07/09/2012 0745   CHOLHDL 3 07/09/2012 0745   VLDL 18.8 07/09/2012 0745   LDLCALC 56 07/09/2012 0745     Physical Exam:    VS:  BP 112/70   Pulse 60   Ht 6\' 1"  (1.854 m)   Wt 193 lb 6.4 oz (87.7 kg)   BMI 25.52 kg/m     Wt Readings from Last 3 Encounters:  04/26/16 193 lb 6.4 oz (87.7 kg)  10/29/15 193 lb 12.8 oz (87.9 kg)  10/14/15 205 lb (93 kg)     Physical Exam  Constitutional: He is oriented to person, place, and time. He appears well-developed and well-nourished. No distress.  HENT:  Head: Normocephalic and atraumatic.  Eyes: No scleral icterus.  Neck: Normal range of motion. No JVD present.  Cardiovascular: Normal rate, regular rhythm, S1 normal and S2 normal.   No murmur  heard. Pulmonary/Chest: Effort normal and breath sounds normal. He has no wheezes. He has no rhonchi. He has no rales.  Abdominal: Soft. There is no tenderness.  Musculoskeletal: He exhibits no edema.  Neurological: He is alert and oriented to person, place, and time.  Skin: Skin is warm and dry.  Psychiatric: He has a normal mood and affect.    ASSESSMENT:    1. Coronary artery disease involving native coronary artery of native heart with angina pectoris ([place])   2. Ischemic cardiomyopathy   3. Essential hypertension   4. Hyperlipidemia with target LDL less than 70    PLAN:    In order of problems listed above:  1. Coronary artery disease involving native coronary artery of native heart with angina pectoris ([place]) - s/pCABG in 1994, DES 2 to the LCx/OM bifurcation in 2010and DES 1 to the ostial LCx in 2014.He has 3 out of 4 grafts known to be occluded.  He is doing well on current medical Rx without angina.  Continue ASA, Plavix, nitrates.  He is not able to tolerate beta-blockers due to bradycardia.    2. Ischemic cardiomyopathy -  Continue ACE inhibitor.  He is NYHA 1.  3. Essential hypertension -  BP is well controlled.   4. Hyperlipidemia with target LDL less than 70 -  LDL in 5/17 was 71. Continue statin.    Dispo:  Return in about 6 months (around 10/27/2016) for Routine Follow Up, w/ Dr. [name].   Medication Adjustments/Labs and Tests Ordered: Current medicines are reviewed at length with the patient today.  Concerns regarding medicines are outlined above.  Medication changes, Labs and Tests ordered today are outlined in the Patient Instructions noted below. Patient Instructions  Medication Instructions:  You have been given two prescription refills on paper. Crestor and Plavix for 90 days.  Labwork: None ordered  Testing/Procedures: None ordered   Follow-Up: Your physician wants you to follow-up in: 6 months with [name] PA-C or Dr. [name]. You will  receive a reminder letter in the mail two months in advance. If you don't receive a letter, please call our office to schedule the  follow-up appointment.  Any Other Special Instructions Will Be Listed Below (If Applicable).  If you need a refill on your cardiac medications before your next appointment, please call your pharmacy.  Signed, [name], PA-C  04/26/2016 5:12 PM    [facility] [place], [place], [place]  [address] Phone: [phone]; Fax: [phone]

## 2016-06-30 DIAGNOSIS — E784 Other hyperlipidemia: Secondary | ICD-10-CM | POA: Diagnosis not present

## 2016-06-30 DIAGNOSIS — I10 Essential (primary) hypertension: Secondary | ICD-10-CM | POA: Diagnosis not present

## 2016-06-30 DIAGNOSIS — E038 Other specified hypothyroidism: Secondary | ICD-10-CM | POA: Diagnosis not present

## 2016-06-30 DIAGNOSIS — Z125 Encounter for screening for malignant neoplasm of prostate: Secondary | ICD-10-CM | POA: Diagnosis not present

## 2016-07-07 DIAGNOSIS — Z1389 Encounter for screening for other disorder: Secondary | ICD-10-CM | POA: Diagnosis not present

## 2016-07-07 DIAGNOSIS — E784 Other hyperlipidemia: Secondary | ICD-10-CM | POA: Diagnosis not present

## 2016-07-07 DIAGNOSIS — I255 Ischemic cardiomyopathy: Secondary | ICD-10-CM | POA: Diagnosis not present

## 2016-07-07 DIAGNOSIS — I10 Essential (primary) hypertension: Secondary | ICD-10-CM | POA: Diagnosis not present

## 2016-07-07 DIAGNOSIS — Z955 Presence of coronary angioplasty implant and graft: Secondary | ICD-10-CM | POA: Diagnosis not present

## 2016-07-07 DIAGNOSIS — R3129 Other microscopic hematuria: Secondary | ICD-10-CM | POA: Diagnosis not present

## 2016-07-07 DIAGNOSIS — Z0000 Encounter for general adult medical examination without abnormal findings: Secondary | ICD-10-CM | POA: Diagnosis not present

## 2016-07-07 DIAGNOSIS — E038 Other specified hypothyroidism: Secondary | ICD-10-CM | POA: Diagnosis not present

## 2016-07-07 DIAGNOSIS — Z9861 Coronary angioplasty status: Secondary | ICD-10-CM | POA: Diagnosis not present

## 2016-07-07 DIAGNOSIS — R0989 Other specified symptoms and signs involving the circulatory and respiratory systems: Secondary | ICD-10-CM | POA: Diagnosis not present

## 2016-07-23 NOTE — Progress Notes (Signed)
Cardiology Office Note:    Date:  07/24/2016   ID:  Brett Chambers, DOB 09-May-1940, MRN [id number]  PCP:  [name], MD  Cardiologist:  Dr. Liam Rogers    Referring MD: [name], MD   Chief Complaint  Patient presents with  . Cardiomyopathy    History of Present Illness:    Brett Chambers is a 76 y.o. male with a hx of CAD, s/p CABG in 1994, HTN, HL. LHC in 08/2008 demonstrated 2/4 grafts patent with high grade disease at the bifurcation of the CFX and OM.  He underwent PCI with a Xience DES x2.  In 7/14, the patient complained of chest discomfort. Stress testing was high risk with anterolateral ischemia. LHC in 7/14 demonstrated occlusion of the SVG-RCA and severe proximal LCx stenosis. He underwent FFR guided PCI with Xience DES to the ostial LCx. PCI of the extensive CTO of the RCA would be technically challenging and this was treated medically.  In 8/17, he developed progressively worsening dyspnea on exertion and LHC demonstrated patent left main, LCx and OM stents as well as patent RIMA-LAD. SVG-RCA was known to be occluded. EF was 45-50%. Medical therapy was continued. Echocardiogram demonstrated EF 40-45% with anteroseptal hypokinesis, mild aortic insufficiency and moderate diastolic dysfunction.  Last seen in 04/2016.    Brett Chambers returns at the request of Dr. Brigitte Pulse.  The patient notes that he was concerned that he was not on a beta-blocker.  I reviewed the chart.  We took Brett Chambers off of Metoprolol in 2014 due to dizziness and a HR of 42.  He felt better off the beta-blocker and we have not been able to use this drug class since.  He denies chest pain, shortness of breath, syncope, orthopnea, PND or significant pedal edema.  He remains quite active without significant limitations.    Prior CV studies:   The following studies were reviewed today:  Echo 10/22/15 EF 40-45%, ant-septal HK, Gr 2 DD, mild AI, mild LAE   LHC 10/14/15 LM - stent patent LAD ost 100% CTO, dist 45% LCx  ostial /prox stent patent with 5% ISR, mid stent patent, OM1 stent patent with 5% ISR, then 30% RCA - ostial 100% CTO with L-R collaterals to RPDA S-RCA 100% RIMA-LAD ok EF 45-50% No change c/w 7/14>>Med Rx   Carotid US 6/17 < 40% bilateral ICA stenosis   Event monitor 8/14:    NSR, sinus brady, very frequent PACs and occasional atrial runs (3-4 beats).  No significant pauses   LHC 08/29/12:  LM and oCFX junction 75% LAD ostial occluded CFX ostial 75-80%, CFX stents patent with 50% ISR at prox edge, 40% before stented segment in mid CFX RCA proximal occluded S-OM occluded S-Dx occluded S-RCA occluded new RIMA-LAD patent EF 55%. FFR guided PCI:  Xience DES to the ostial CFX.   Myoview 08/2012 High risk stress nuclear study Marked ECG abnormalities Inferior wall infarct base and mid level with severe ischemia in moderate anterolateral sized area from apex to base.   Echo 9/09:  EF 55-60%, mild MR, mild TR, mild AI, mild Ao sclerosis.    Myoview 08/2008 Anterolateral ischemia, EF 57%.    Past Medical History:  Diagnosis Date  . Bradycardia    a. event monitor (8/14):  NSR, sinus brady, very frequent PACs and occasional atrial runs (3-4 beats).  No significant pauses.  . Clavicle fracture    left  . Coronary artery disease    a. s/p CABG  1994 // b. Abnl MV 08/2008: Ant-lat ischemia, EF 57%=>LHC 7/10: Xience DES x2 to bifurc lesion of LCx/OM // c. Abnl MV 08/2012-> cath with interval occ of SVG-RCA, CTO of LAD/RCA, severe prox LCx stenosis s/p FFR-guided DES to LCx // e. LHC 8/17: LM stent ok, oLAD 100, dLAD 45, RIMA-LAD ok, oLCx stent ok, OM1 stent ok, oRCA 100, S-RCA 100, L-R collats, EF 45-50 >> med Rx  . GERD (gastroesophageal reflux disease)   . Hyperlipidemia   . Hypertension    mild hypertension  . Ischemic cardiomyopathy    a. Echo 9/17: EF 40-45%, anteroseptal HK, grade 2 diastolic dysfunction, mild AI, mild LAE  . Thrombocytopenia ([place])    a. Noted on labs 08/2012.      Past Surgical History:  Procedure Laterality Date  . CARDIAC CATHETERIZATION     His most recent in August of 2010 showed totally occluded LAD,  . CARDIAC CATHETERIZATION N/A 10/14/2015   Procedure: Left Heart Cath and Coronary Angiography;  Surgeon: Leonie Man, MD;  Location: [facility];  Service: Cardiovascular;  Laterality: N/A;  . COLONOSCOPY    . CORONARY ANGIOPLASTY    . CORONARY ARTERY BYPASS GRAFT  1993   RIMA-LAD, SVG-1ST, 2ND DIAG, SVG-1ST, 2 ND OM, PD  . CORONARY STENT PLACEMENT  7/10   Stent- Left Cirx  . FRACTIONAL FLOW RESERVE WIRE N/A 08/29/2012   Procedure: FRACTIONAL FLOW RESERVE WIRE;  Surgeon: Sherren Mocha, MD;  Location: [facility] C[facility];  Service: Cardiovascular;  Laterality: N/A;  . FRACTURE SURGERY     right  . ORIF CLAVICULAR FRACTURE Left 05/21/2014   Procedure: OPEN REDUCTION INTERNAL FIXATION (ORIF) CLAVICULAR FRACTURE;  Surgeon: Tania Ade, MD;  Location: [facility];  Service: Orthopedics;  Laterality: Left;  Open reduction internal fixation clavical fracture  . US ECHOCARDIOGRAPHY  10-29-2007   EF 55-60%    Current Medications: Current Meds  Medication Sig  . aspirin EC 81 MG tablet Take 81 mg by mouth daily.  . clopidogrel (PLAVIX) 75 MG tablet TAKE ONE TABLET BY MOUTH ONCE DAILY.  . isosorbide mononitrate (IMDUR) 30 MG 24 hr tablet TAKE 0.5 TABLETS (15 MG TOTAL) BY MOUTH DAILY  . lisinopril (PRINIVIL,ZESTRIL) 5 MG tablet Take 5 mg by mouth every evening.  . nitroGLYCERIN (NITROSTAT) 0.4 MG SL tablet Place 1 tablet (0.4 mg total) under the tongue every 5 (five) minutes as needed for chest pain.  . [DISCONTINUED] rosuvastatin (CRESTOR) 5 MG tablet Take 1 tablet (5 mg total) by mouth daily.     Allergies:   Lipitor [atorvastatin]   Social History   Social History  . Marital status: Married    Spouse name: N/A  . Number of children: N/A  . Years of education: N/A   Social History Main Topics  . Smoking status: Former Smoker    Quit  date: 02/14/1963  . Smokeless tobacco: Never Used  . Alcohol use No  . Drug use: No  . Sexual activity: Not Asked   Other Topics Concern  . None   Social History Narrative  . None     Family Hx: The patient's family history includes Heart disease in his brother; Hypertension in his other; Stroke in his other.  ROS:   Please see the history of present illness.    ROS All other systems reviewed and are negative.   EKGs/Labs/Other Test Reviewed:    EKG:  EKG is  ordered today.  The ekg ordered today demonstrates sinus brady, HR  59, normal axis, QTc 417 ms, no changes  Recent Labs: 10/12/2015: Brain Natriuretic Peptide 299.8; BUN 10; Creat 1.26; Hemoglobin 13.3; Platelets 121; Potassium 3.8; Sodium 142  Labs from PCP 06/30/16: Creatinine 1.1, K 5.4, ALT 21, Hgb 14.5, TC 132, TG 131, HDL 36, LDL 70  Recent Lipid Panel Lab Results  Component Value Date/Time   CHOL 114 07/09/2012 07:45 AM   TRIG 94.0 07/09/2012 07:45 AM   HDL 39.70 07/09/2012 07:45 AM   CHOLHDL 3 07/09/2012 07:45 AM   LDLCALC 56 07/09/2012 07:45 AM    Physical Exam:    VS:  BP 140/80   Pulse (!) 59   Ht 6' (1.829 m)   Wt 198 lb (89.8 kg)   BMI 26.85 kg/m     Wt Readings from Last 3 Encounters:  07/24/16 198 lb (89.8 kg)  04/26/16 193 lb 6.4 oz (87.7 kg)  10/29/15 193 lb 12.8 oz (87.9 kg)     Physical Exam  Constitutional: He is oriented to person, place, and time. He appears well-developed and well-nourished. No distress.  HENT:  Head: Normocephalic and atraumatic.  Eyes: No scleral icterus.  Neck: Normal range of motion. No JVD present.  Cardiovascular: Normal rate, regular rhythm, S1 normal and S2 normal.   No murmur heard. Pulmonary/Chest: Effort normal and breath sounds normal. He has no wheezes. He has no rhonchi. He has no rales.  Abdominal: Soft. There is no tenderness.  Musculoskeletal: He exhibits no edema.  Neurological: He is alert and oriented to person, place, and time.  Skin: Skin  is warm and dry.  Psychiatric: He has a normal mood and affect.    ASSESSMENT:    1. Coronary artery disease involving native coronary artery of native heart with angina pectoris ([place])   2. Ischemic cardiomyopathy   3. Essential hypertension   4. Hyperlipidemia with target LDL less than 70    PLAN:    In order of problems listed above:  1. Coronary artery disease involving native coronary artery of native heart with angina pectoris ([place]) -  s/p CABG in 1994, DES 2 to the LCx/OM bifurcation in 2010 and DES 1 to the ostial LCx in 2014.   He has 3 out of 4 grafts known to be occluded.   Cardiac catheterization in 09/2015 demonstrated patent stents in the left main, LCx and OM and patent RIMA-LAD. He has extensive left to right collaterals. He has continued to do well on medical therapy without anginal symptoms. He will need to remain on dual antiplatelet therapy with aspirin, clopidogrel. Continue isosorbide, statin.  2. Ischemic cardiomyopathy - EF 40-45 by echocardiogram 9/17. He is NYHA 1. He has not been able to tolerate beta blockers in the past secondary to symptomatic bradycardia.  Continue ACE inhibitor, nitrates.   **Addendum:  He may be able to tolerate a beta-blocker with intrinsic sympathomimetic activity (ISA) without causing bradycardia. Although these drugs are not well studied in congestive heart failure, it may be worthwhile trying to see if he can tolerate.  I called him and discussed this after he left the office.  He is willing to try.  He will let us know if he feels poorly on this drug.  -  Pindolol 2.5 mg once daily.  -  Arrange ECG in 2-4 weeks.  3. Essential hypertension - The patient's blood pressure is controlled on his current regimen.  Continue current therapy.    4. Hyperlipidemia with target LDL less than 70 -  Recent LDL  70.  With his advanced CAD, we should try to get his LDL below 70.  He is taking a small dose of Rosuvastatin.  He has had side effects with  Atorvastatin in the past.    -  Increase Rosuvastatin to 10 mg QD  -  Lipids and LFTs in 3 mos.    Dispo:  Return in about 6 months (around 01/23/2017) for Routine Follow Up, w/ [name], PA-C.   Medication Adjustments/Labs and Tests Ordered: Current medicines are reviewed at length with the patient today.  Concerns regarding medicines are outlined above.  Orders/Tests:  Orders Placed This Encounter  Procedures  . Lipid Profile  . Hepatic function panel  . EKG 12-Lead   Medication changes: Meds ordered this encounter  Medications  . DISCONTD: rosuvastatin (CRESTOR) 10 MG tablet    Sig: Take 1 tablet (10 mg total) by mouth daily.    Dispense:  90 tablet    Refill:  3  . rosuvastatin (CRESTOR) 10 MG tablet    Sig: Take 1 tablet (10 mg total) by mouth daily.    Dispense:  90 tablet    Refill:  3  . pindolol (VISKEN) 5 MG tablet    Sig: Take 0.5 tablets (2.5 mg total) by mouth daily.    Dispense:  15 tablet    Refill:  11    Order Specific Question:   Supervising Provider    Answer:   Evans Lance [[id number]]   Signed, [name], PA-C  07/24/2016 11:40 AM    [facility] [place], [place], [place]  [address] Phone: [phone]; Fax: [phone]

## 2016-07-24 ENCOUNTER — Encounter: Payer: Self-pay | Admitting: Physician Assistant

## 2016-07-24 ENCOUNTER — Ambulatory Visit (INDEPENDENT_AMBULATORY_CARE_PROVIDER_SITE_OTHER): Payer: Medicare HMO | Admitting: Physician Assistant

## 2016-07-24 VITALS — BP 140/80 | HR 59 | Ht 72.0 in | Wt 198.0 lb

## 2016-07-24 DIAGNOSIS — I25119 Atherosclerotic heart disease of native coronary artery with unspecified angina pectoris: Secondary | ICD-10-CM

## 2016-07-24 DIAGNOSIS — I255 Ischemic cardiomyopathy: Secondary | ICD-10-CM | POA: Diagnosis not present

## 2016-07-24 DIAGNOSIS — I10 Essential (primary) hypertension: Secondary | ICD-10-CM

## 2016-07-24 DIAGNOSIS — E785 Hyperlipidemia, unspecified: Secondary | ICD-10-CM

## 2016-07-24 MED ORDER — PINDOLOL 5 MG PO TABS: 2.5000 mg | ORAL_TABLET | Freq: Every day | ORAL | 11 refills | Status: DC

## 2016-07-24 MED ORDER — ROSUVASTATIN CALCIUM 10 MG PO TABS: 10.0000 mg | ORAL_TABLET | Freq: Every day | ORAL | 3 refills | Status: DC

## 2016-07-24 NOTE — Patient Instructions (Addendum)
Medication Instructions:  1. INCREASE CRESTOR TO 10 MG DAILY; NEW RX HAS BEEN SENT IN  2. START PINDOLOL 5 MG TABLET WITH THE DIRECTIONS TO TAKE 1/2 TABLET (2.5 MG) ONCE A DAY  Labwork: IN 3 MONTHS FASTING LIPID AND LIVER PANEL 10/23/16 LAB OPENS AT 7:30 AM  Testing/Procedures: NONE ORDERED  Follow-Up: [employer], [facility] 12/114/18 @ 9:15  YOU HAVE BEEN SCHEDULED FOR A NURSE VISIT TO BE DONE ON 08/23/16 @ 9 AM FOR AN EKG DUE TO NEW START OF PINDOLOL PER SCOTT W. PAC  Any Other Special Instructions Will Be Listed Below (If Applicable).     If you need a refill on your cardiac medications before your next appointment, please call your pharmacy.

## 2016-07-25 ENCOUNTER — Telehealth: Payer: Self-pay | Admitting: Physician Assistant

## 2016-07-25 MED ORDER — ROSUVASTATIN CALCIUM 40 MG PO TABS: 20.0000 mg | ORAL_TABLET | Freq: Every day | ORAL | Status: DC

## 2016-07-25 NOTE — Telephone Encounter (Signed)
I called the pt to advise of recommendation per Brynda Rim. PA to continue on the Crestor as he has been taking per the directions from the [place] doctor, Crestor 40 mg tablet directions take 1/2 tab daily. Pt thanked me for my call and help.

## 2016-07-25 NOTE — Telephone Encounter (Signed)
I returned pt's call about medication mix up. Pt states he told [place] yesterday at [place] he was taking Crestor 5 mg daily. Pt states this is wrong, that the [place] has him on Crestor 40 mg tablet with directions to take 1/2 tab daily = 20 mg daily. Pt states he wanted the PA to know. I let pt know that I will d/w PA. Advised I will call back with PA recommendations. Pt thanked me for my time and help.

## 2016-07-25 NOTE — Telephone Encounter (Signed)
Follow Up:+   Pt says he saw [name] yesterday and he thinks it is a mix up about his medicine.

## 2016-07-25 NOTE — Telephone Encounter (Signed)
Most recent Lipids looked good. I would stay on the dose of Crestor that he has been taking. Please update his chart to reflect his correct dose. [name], PA-C    07/25/2016 5:09 PM

## 2016-08-23 ENCOUNTER — Ambulatory Visit (INDEPENDENT_AMBULATORY_CARE_PROVIDER_SITE_OTHER): Payer: Medicare HMO

## 2016-08-23 VITALS — Wt 199.8 lb

## 2016-08-23 DIAGNOSIS — I255 Ischemic cardiomyopathy: Secondary | ICD-10-CM

## 2016-08-23 DIAGNOSIS — Z79899 Other long term (current) drug therapy: Secondary | ICD-10-CM

## 2016-08-23 DIAGNOSIS — I25119 Atherosclerotic heart disease of native coronary artery with unspecified angina pectoris: Secondary | ICD-10-CM

## 2016-08-23 NOTE — Patient Instructions (Addendum)
1.) Reason for visit: EKG for initiation of Pindolol 2.5 mg daily  2.) Name of MD requesting visit: [name], PA   3.) H&P: Patient presented today for EKG after starting Pindolol 2.5 daily on 07/25/16.  ROS related to problem:  Patient stated he was fatigued and HR was 40s-50s for the first 2 weeks after starting medication. Patient stated symptoms resolved on week three of new medication. Patient stated he feels okay today, and HR has been above 50 since week three on medication. Pt stated he feels good if HR stays above 50.   4.) Assessment and plan per MD: Dr. Burt Knack reviewed and signed EKG. Patient may continue current medications.

## 2016-10-19 ENCOUNTER — Other Ambulatory Visit: Payer: Self-pay | Admitting: Physician Assistant

## 2016-10-19 DIAGNOSIS — I2510 Atherosclerotic heart disease of native coronary artery without angina pectoris: Secondary | ICD-10-CM

## 2016-10-23 ENCOUNTER — Other Ambulatory Visit: Payer: Medicare HMO | Admitting: *Deleted

## 2016-10-23 DIAGNOSIS — E785 Hyperlipidemia, unspecified: Secondary | ICD-10-CM | POA: Diagnosis not present

## 2016-10-23 DIAGNOSIS — I25119 Atherosclerotic heart disease of native coronary artery with unspecified angina pectoris: Secondary | ICD-10-CM | POA: Diagnosis not present

## 2016-10-23 LAB — HEPATIC FUNCTION PANEL
ALT: 14 IU/L (ref 0–44)
AST: 16 IU/L (ref 0–40)
Albumin: 4.2 g/dL (ref 3.5–4.8)
Alkaline Phosphatase: 113 IU/L (ref 39–117)
Bilirubin Total: 0.3 mg/dL (ref 0.0–1.2)
Bilirubin, Direct: 0.09 mg/dL (ref 0.00–0.40)
Total Protein: 6.4 g/dL (ref 6.0–8.5)

## 2016-10-23 LAB — LIPID PANEL
CHOL/HDL RATIO: 2.7 ratio (ref 0.0–5.0)
CHOLESTEROL TOTAL: 114 mg/dL (ref 100–199)
HDL: 43 mg/dL (ref >39)
LDL CALC: 52 mg/dL (ref 0–99)
TRIGLYCERIDES: 96 mg/dL (ref 0–149)
VLDL Cholesterol Cal: 19 mg/dL (ref 5–40)

## 2016-10-24 ENCOUNTER — Telehealth: Payer: Self-pay | Admitting: *Deleted

## 2016-10-24 NOTE — Telephone Encounter (Signed)
-----   Message from Liliane Shi, [place] sent at 10/24/2016 11:02 AM EDT ----- Please call patient. The LDL is at goal.  The liver enzyme tests are normal. Continue with current treatment plan. [name], PA-C 10/24/2016 11:02 AM  10/24/2016 11:02 AM

## 2016-10-24 NOTE — Telephone Encounter (Signed)
Pt has been notified of lab results by phone with verbal understanding. Pt would like a copy of results be mailed to him, I verified pt's address. Pt thanked me for my call today.

## 2016-10-30 ENCOUNTER — Other Ambulatory Visit: Payer: Self-pay

## 2016-10-30 ENCOUNTER — Other Ambulatory Visit: Payer: Self-pay | Admitting: Physician Assistant

## 2016-10-30 MED ORDER — ROSUVASTATIN CALCIUM 10 MG PO TABS: 10.0000 mg | ORAL_TABLET | Freq: Every day | ORAL | 3 refills | Status: DC

## 2016-11-22 ENCOUNTER — Encounter: Payer: Self-pay | Admitting: Physician Assistant

## 2016-12-04 DIAGNOSIS — R69 Illness, unspecified: Secondary | ICD-10-CM | POA: Diagnosis not present

## 2017-01-19 ENCOUNTER — Other Ambulatory Visit: Payer: Self-pay | Admitting: Cardiovascular Disease

## 2017-01-26 ENCOUNTER — Ambulatory Visit: Payer: Medicare HMO | Admitting: Physician Assistant

## 2017-01-29 ENCOUNTER — Telehealth: Payer: Self-pay | Admitting: Physician Assistant

## 2017-01-29 NOTE — Telephone Encounter (Signed)
New message    Patient requesting order for lab work. Order in [place] expired.  Please advise

## 2017-01-29 NOTE — Telephone Encounter (Signed)
I returned call to the pt who was not sure if he was due for lab work or not. I reviewed pt's chart and did not see where any lab work was ordered. Pt is scheduled to see [name], PA 02/19/17. Pt thanked me for calling him back.

## 2017-02-01 ENCOUNTER — Ambulatory Visit: Payer: Medicare HMO | Admitting: Physician Assistant

## 2017-02-18 NOTE — Progress Notes (Signed)
Cardiology Office Note:    Date:  02/19/2017   ID:  Brett Chambers, DOB [birth year], MRN [id number]  PCP:  Brett Redwood, MD  Cardiologist:  Brett Moores, MD   Referring MD: Brett Redwood, MD   Chief Complaint  Patient presents with  . Follow-up    CAD    History of Present Illness:    Brett Chambers is a 77 y.o. male with a hx of CAD, s/p CABG in 1994, HTN, HL. LHC in 08/2008 demonstrated 2/4 grafts patent with high grade disease at the bifurcation of the CFX and OM.He underwent PCIwith a Xience DES x2.In 7/14, the patient complained of chest discomfort. Stress testing was high risk with anterolateral ischemia. LHC in 7/14 demonstrated occlusion of the SVG-RCA and severe proximal LCx stenosis. He underwent FFR guided PCI with Xience DES to the ostial LCx. PCI of the extensive CTO of the RCA would be technically challenging and this was treated medically.In 8/17, he developed progressively worsening dyspnea on exertion and LHCdemonstrated patent left main, LCx and OM stents as well as patent RIMA-LAD. SVG-RCA was known to be occluded. EF was 45-50%. Medical therapy was continued. Echocardiogram demonstrated EF 40-45% with anteroseptal hypokinesis, mild aortic insufficiency and moderate diastolic dysfunction. He has been intolerant of beta-blockers in the past due to bradycardia and dizziness.  He was last seen in 6/18.  At that time, we decided to try him on Pindolol (due to its intrinsic sympathomimetic activity) to see if he could tolerate it.    [name] returns for follow-up.  He has had a couple of episodes of lower left-sided discomfort.  He describes this as sharp and it only lasts seconds.  It is not related to any activity.  He has not had any recurrent symptoms of angina.  He denies dyspnea, syncope, PND or significant edema.  He walks 3 miles several times a week.  Prior CV studies:   The following studies were reviewed today:  Echo 10/22/15 EF 40-45%, ant-septal HK, Gr 2 DD,  mild AI, mild LAE  LHC 10/14/15 LM - stent patent LAD ost 100% CTO, dist 45% LCx ostial /prox stent patent with 5% ISR, mid stent patent, OM1 stent patent with 5% ISR, then 30% RCA - ostial 100% CTO with L-R collaterals to RPDA S-RCA 100% RIMA-LAD ok EF 45-50% No change c/w 7/14>>Med Rx   Carotid US 6/17 < 40% bilateral ICA stenosis  Event monitor 8/14: NSR, sinus brady, very frequent PACs and occasional atrial runs (3-4 beats).No significant pauses  LHC 08/29/12: LM and oCFX junction 75% LAD ostial occluded CFX ostial 75-80%, CFX stents patent with 50% ISR at prox edge, 40% before stented segment in mid CFX RCA proximal occluded S-OM occluded S-Dx occluded S-RCA occluded new RIMA-LAD patent EF 55%. FFR guided PCI: Xience DES to the ostial CFX.  Myoview 08/2012 High risk stress nuclear study Marked ECG abnormalities Inferior wall infarct base and mid level with severe ischemia in moderate anterolateral sized area from apex to base.  Echo 9/09:  EF 55-60%, mild MR, mild TR, mild AI, mild Ao sclerosis.   Myoview 08/2008 Anterolateral ischemia, EF 57%.    Past Medical History:  Diagnosis Date  . Bradycardia    a. event monitor (8/14):  NSR, sinus brady, very frequent PACs and occasional atrial runs (3-4 beats).  No significant pauses.  . Clavicle fracture    left  . Coronary artery disease    a. s/p CABG 1994 // b. Abnl MV  08/2008: Ant-lat ischemia, EF 57%=>LHC 7/10: Xience DES x2 to bifurc lesion of LCx/OM // c. Abnl MV 08/2012-> cath with interval occ of SVG-RCA, CTO of LAD/RCA, severe prox LCx stenosis s/p FFR-guided DES to LCx // e. LHC 8/17: LM stent ok, oLAD 100, dLAD 45, RIMA-LAD ok, oLCx stent ok, OM1 stent ok, oRCA 100, S-RCA 100, L-R collats, EF 45-50 >> med Rx  . GERD (gastroesophageal reflux disease)   . Hyperlipidemia   . Hypertension    mild hypertension  . Ischemic cardiomyopathy    a. Echo 9/17: EF 40-45%, anteroseptal HK, grade 2 diastolic  dysfunction, mild AI, mild LAE  . Thrombocytopenia ([place])    a. Noted on labs 08/2012.    Past Surgical History:  Procedure Laterality Date  . CARDIAC CATHETERIZATION     His most recent in August of 2010 showed totally occluded LAD,  . CARDIAC CATHETERIZATION N/A 10/14/2015   Procedure: Left Heart Cath and Coronary Angiography;  Surgeon: Brett Man, MD;  Location: [facility];  Service: Cardiovascular;  Laterality: N/A;  . COLONOSCOPY    . CORONARY ANGIOPLASTY    . CORONARY ARTERY BYPASS GRAFT  1993   RIMA-LAD, SVG-1ST, 2ND DIAG, SVG-1ST, 2 ND OM, PD  . CORONARY STENT PLACEMENT  7/10   Stent- Left Cirx  . FRACTIONAL FLOW RESERVE WIRE N/A 08/29/2012   Procedure: FRACTIONAL FLOW RESERVE WIRE;  Surgeon: Brett Mocha, MD;  Location: [facility];  Service: Cardiovascular;  Laterality: N/A;  . FRACTURE SURGERY     right  . ORIF CLAVICULAR FRACTURE Left 05/21/2014   Procedure: OPEN REDUCTION INTERNAL FIXATION (ORIF) CLAVICULAR FRACTURE;  Surgeon: Brett Ade, MD;  Location: [place] [facility];  Service: Orthopedics;  Laterality: Left;  Open reduction internal fixation clavical fracture  . US ECHOCARDIOGRAPHY  10-29-2007   EF 55-60%    Current Medications: Current Meds  Medication Sig  . aspirin EC 81 MG tablet Take 81 mg by mouth daily.  . clopidogrel (PLAVIX) 75 MG tablet TAKE ONE TABLET BY MOUTH ONCE DAILY.  . isosorbide mononitrate (IMDUR) 30 MG 24 hr tablet TAKE 0.5 TABLETS (15 MG TOTAL) BY MOUTH DAILY  . lisinopril (PRINIVIL,ZESTRIL) 5 MG tablet Take 5 mg by mouth every evening.  . nitroGLYCERIN (NITROSTAT) 0.4 MG SL tablet Place 1 tablet (0.4 mg total) under the tongue every 5 (five) minutes as needed for chest pain.     Allergies:   Lipitor [atorvastatin]   Social History   Tobacco Use  . Smoking status: Former Smoker    Last attempt to quit: 02/14/1963    Years since quitting: 54.0  . Smokeless tobacco: Never Used  Substance Use Topics  . Alcohol use: No  . Drug use: No       Family Hx: The patient's family history includes Heart disease in his brother; Hypertension in his other; Stroke in his other.  ROS:   Please see the history of present illness.    ROS All other systems reviewed and are negative.   EKGs/Labs/Other Test Reviewed:    EKG:  EKG is   ordered today.  The ekg ordered today demonstrates sinus brady, HR 52, normal axis, QTc 412 ms, no significant change from prior tracing.   Recent Labs: 10/23/2016: ALT 14   Recent Lipid Panel Lab Results  Component Value Date/Time   CHOL 114 10/23/2016 08:34 AM   TRIG 96 10/23/2016 08:34 AM   HDL 43 10/23/2016 08:34 AM   CHOLHDL 2.7 10/23/2016 08:34 AM  CHOLHDL 3 07/09/2012 07:45 AM   LDLCALC 52 10/23/2016 08:34 AM    Physical Exam:    VS:  BP 120/62   Pulse (!) 52   Ht 6' (1.829 m)   Wt 199 lb 1.9 oz (90.3 kg)   SpO2 99%   BMI 27.01 kg/m     Wt Readings from Last 3 Encounters:  02/19/17 199 lb 1.9 oz (90.3 kg)  08/23/16 199 lb 12.8 oz (90.6 kg)  07/24/16 198 lb (89.8 kg)     Physical Exam  Constitutional: He is oriented to person, place, and time. He appears well-developed and well-nourished. No distress.  HENT:  Head: Normocephalic and atraumatic.  Eyes: No scleral icterus.  Neck: No JVD present.  Cardiovascular: Normal rate and regular rhythm.  No murmur heard. Pulmonary/Chest: Effort normal. He has no rales.  Abdominal: Soft. He exhibits no distension.  Musculoskeletal: He exhibits no edema.  Neurological: He is alert and oriented to person, place, and time.  Skin: Skin is warm and dry.    ASSESSMENT:    1. Coronary artery disease involving native coronary artery of native heart with angina pectoris ([place])   2. Ischemic cardiomyopathy   3. Essential hypertension   4. Hyperlipidemia with target LDL less than 70    PLAN:    In order of problems listed above:  1.  Coronary artery disease s/pCABG in 1994, DES 2 to the LCx/OM bifurcation in 2010and DES 1 to the  ostial LCx in 2014.He has 3 out of 4 grafts known to be occluded.  Cardiac catheterization in 09/2015 demonstrated patent stents in the left main, LCx and OM and patent RIMA-LAD. He has extensive left to right collaterals.  He has not had any recurrent symptoms of angina.  He has had some atypical lower chest, upper abdomen pain on the left.  This does not seem to be associated with any cardiac issues.  His ECG is unchanged.  Continue current regimen which includes aspirin, Plavix, isosorbide, pindolol, rosuvastatin.  2. Ischemic cardiomyopathy EF 40-45 by echo in 9/17.  No evidence of volume excess.  He is NYHA 1.  Continue ACE inhibitor, nitrates, beta-blocker.  3. Essential hypertension The patient's blood pressure is controlled on his current regimen.  Continue current therapy.   4. Hyperlipidemia LDL optimal on most recent lab work.  Continue current Rx.  Of note, he is certain he used to be on 15 mg of rosuvastatin.  We currently have him listed as taking 10 mg.  If his LDL increases above 70 at next check, increase rosuvastatin back to 15 mg daily.   Dispo:  Return in about 6 months (around 08/19/2017) for Routine Follow Up, w/ Dr. [name], or [name], PA-C.   Medication Adjustments/Labs and Tests Ordered: Current medicines are reviewed at length with the patient today.  Concerns regarding medicines are outlined above.  Tests Ordered: Orders Placed This Encounter  Procedures  . EKG 12-Lead   Medication Changes: Meds ordered this encounter  Medications  . pindolol (VISKEN) 5 MG tablet    Sig: Take 0.5 tablets (2.5 mg total) by mouth daily.    Dispense:  90 tablet    Refill:  3    Signed, [name], PA-C  02/19/2017 9:28 AM    [facility] [address], [place], [place]  [address] Phone: [phone]; Fax: [phone]

## 2017-02-19 ENCOUNTER — Encounter: Payer: Self-pay | Admitting: Physician Assistant

## 2017-02-19 ENCOUNTER — Ambulatory Visit: Payer: Medicare HMO | Admitting: Physician Assistant

## 2017-02-19 VITALS — BP 120/62 | HR 52 | Ht 72.0 in | Wt 199.1 lb

## 2017-02-19 DIAGNOSIS — E785 Hyperlipidemia, unspecified: Secondary | ICD-10-CM

## 2017-02-19 DIAGNOSIS — I10 Essential (primary) hypertension: Secondary | ICD-10-CM

## 2017-02-19 DIAGNOSIS — I255 Ischemic cardiomyopathy: Secondary | ICD-10-CM | POA: Diagnosis not present

## 2017-02-19 DIAGNOSIS — I25119 Atherosclerotic heart disease of native coronary artery with unspecified angina pectoris: Secondary | ICD-10-CM

## 2017-02-19 MED ORDER — PINDOLOL 5 MG PO TABS: 2.5000 mg | ORAL_TABLET | Freq: Every day | ORAL | 3 refills | Status: DC

## 2017-02-19 NOTE — Patient Instructions (Signed)
Medication Instructions:  1. CONTINUE WITH IMDUR 30 MG TABLET WITH THE DIRECTIONS TO TAKE 1/2 TABLET DAILY = 15 MG DAILY  AN RX FOR PINDOLOL # 90 HAS BEEN SENT IN   Labwork: NONE ORDERED TODAY  Testing/Procedures: NONE ORDERED  TODAY  Follow-Up: Your physician wants you to follow-up in: [place], PAC OR DR. [name] You will receive a reminder letter in the mail two months in advance. If you don't receive a letter, please call our office to schedule the follow-up appointment.   Any Other Special Instructions Will Be Listed Below (If Applicable).     If you need a refill on your cardiac medications before your next appointment, please call your pharmacy.

## 2017-07-05 DIAGNOSIS — R82998 Other abnormal findings in urine: Secondary | ICD-10-CM | POA: Diagnosis not present

## 2017-07-05 DIAGNOSIS — E038 Other specified hypothyroidism: Secondary | ICD-10-CM | POA: Diagnosis not present

## 2017-07-05 DIAGNOSIS — Z125 Encounter for screening for malignant neoplasm of prostate: Secondary | ICD-10-CM | POA: Diagnosis not present

## 2017-07-05 DIAGNOSIS — I10 Essential (primary) hypertension: Secondary | ICD-10-CM | POA: Diagnosis not present

## 2017-07-05 DIAGNOSIS — E7849 Other hyperlipidemia: Secondary | ICD-10-CM | POA: Diagnosis not present

## 2017-07-12 DIAGNOSIS — Z0000 Encounter for general adult medical examination without abnormal findings: Secondary | ICD-10-CM | POA: Diagnosis not present

## 2017-07-12 DIAGNOSIS — E038 Other specified hypothyroidism: Secondary | ICD-10-CM | POA: Diagnosis not present

## 2017-07-12 DIAGNOSIS — E7849 Other hyperlipidemia: Secondary | ICD-10-CM | POA: Diagnosis not present

## 2017-07-12 DIAGNOSIS — K219 Gastro-esophageal reflux disease without esophagitis: Secondary | ICD-10-CM | POA: Diagnosis not present

## 2017-07-12 DIAGNOSIS — D696 Thrombocytopenia, unspecified: Secondary | ICD-10-CM | POA: Diagnosis not present

## 2017-07-12 DIAGNOSIS — I10 Essential (primary) hypertension: Secondary | ICD-10-CM | POA: Diagnosis not present

## 2017-07-12 DIAGNOSIS — Z955 Presence of coronary angioplasty implant and graft: Secondary | ICD-10-CM | POA: Diagnosis not present

## 2017-07-12 DIAGNOSIS — I255 Ischemic cardiomyopathy: Secondary | ICD-10-CM | POA: Diagnosis not present

## 2017-07-12 DIAGNOSIS — Z9861 Coronary angioplasty status: Secondary | ICD-10-CM | POA: Diagnosis not present

## 2017-07-12 DIAGNOSIS — Z86711 Personal history of pulmonary embolism: Secondary | ICD-10-CM | POA: Diagnosis not present

## 2017-08-11 ENCOUNTER — Other Ambulatory Visit: Payer: Self-pay | Admitting: Cardiovascular Disease

## 2017-08-27 ENCOUNTER — Encounter: Payer: Self-pay | Admitting: Cardiovascular Disease

## 2017-08-27 ENCOUNTER — Ambulatory Visit: Payer: Medicare HMO | Admitting: Cardiovascular Disease

## 2017-08-27 DIAGNOSIS — E7800 Pure hypercholesterolemia, unspecified: Secondary | ICD-10-CM

## 2017-08-27 DIAGNOSIS — I2510 Atherosclerotic heart disease of native coronary artery without angina pectoris: Secondary | ICD-10-CM | POA: Diagnosis not present

## 2017-08-27 MED ORDER — NITROGLYCERIN 0.4 MG SL SUBL: 0.4000 mg | SUBLINGUAL_TABLET | SUBLINGUAL | 3 refills | Status: DC | PRN

## 2017-08-27 NOTE — Patient Instructions (Addendum)
Medication Instructions:  Your physician has recommended you make the following change in your medication:   STOP Pindolol ([place])   Labwork: None Ordered   Testing/Procedures: None Ordered   Follow-Up: Your physician wants you to follow-up in: 6 months with [name], PA.  You will receive a reminder letter in the mail two months in advance. If you don't receive a letter, please call our office to schedule the follow-up appointment.   If you need a refill on your cardiac medications before your next appointment, please call your pharmacy.   Thank you for choosing [facility]! Christen Bame, RN [phone]

## 2017-08-27 NOTE — Progress Notes (Signed)
Cardiology Office Note:    Date:  08/27/2017   ID:  RAFIEL MECCA, DOB [birth year], MRN [id number]  PCP:  [name], MD  Cardiologist:  Mertie Moores, MD   Referring MD: [name], MD   Chief Complaint  Patient presents with  . Coronary Artery Disease    Notes from [name], [place]    TEAGHAN FORMICA is a 77 y.o. male with a hx of CAD, s/p CABG in 1994, HTN, HL. LHC in 08/2008 demonstrated 2/4 grafts patent with high grade disease at the bifurcation of the CFX and OM.He underwent PCIwith a Xience DES x2.In 7/14, the patient complained of chest discomfort. Stress testing was high risk with anterolateral ischemia. LHC in 7/14 demonstrated occlusion of the SVG-RCA and severe proximal LCx stenosis. He underwent FFR guided PCI with Xience DES to the ostial LCx. PCI of the extensive CTO of the RCA would be technically challenging and this was treated medically.In 8/17, he developed progressively worsening dyspnea on exertion and LHCdemonstrated patent left main, LCx and OM stents as well as patent RIMA-LAD. SVG-RCA was known to be occluded. EF was 45-50%. Medical therapy was continued. Echocardiogram demonstrated EF 40-45% with anteroseptal hypokinesis, mild aortic insufficiency and moderate diastolic dysfunction. He has been intolerant of beta-blockers in the past due to bradycardia and dizziness.  He was last seen in 6/18.  At that time, we decided to try him on Pindolol (due to its intrinsic sympathomimetic activity) to see if he could tolerate it.    Mr. Adachi returns for follow-up.  He has had a couple of episodes of lower left-sided discomfort.  He describes this as sharp and it only lasts seconds.  It is not related to any activity.  He has not had any recurrent symptoms of angina.  He denies dyspnea, syncope, PND or significant edema.  He walks 3 miles several times a week.  Prior CV studies:   The following studies were reviewed today:  Echo 10/22/15 EF 40-45%, ant-septal HK, Gr 2  DD, mild AI, mild LAE  LHC 10/14/15 LM - stent patent LAD ost 100% CTO, dist 45% LCx ostial /prox stent patent with 5% ISR, mid stent patent, OM1 stent patent with 5% ISR, then 30% RCA - ostial 100% CTO with L-R collaterals to RPDA S-RCA 100% RIMA-LAD ok EF 45-50% No change c/w 7/14>>Med Rx   Carotid US 6/17 < 40% bilateral ICA stenosis  Event monitor 8/14: NSR, sinus brady, very frequent PACs and occasional atrial runs (3-4 beats).No significant pauses  LHC 08/29/12: LM and oCFX junction 75% LAD ostial occluded CFX ostial 75-80%, CFX stents patent with 50% ISR at prox edge, 40% before stented segment in mid CFX RCA proximal occluded S-OM occluded S-Dx occluded S-RCA occluded new RIMA-LAD patent EF 55%. FFR guided PCI: Xience DES to the ostial CFX.  Myoview 08/2012 High risk stress nuclear study Marked ECG abnormalities Inferior wall infarct base and mid level with severe ischemia in moderate anterolateral sized area from apex to base.  Echo 9/09:  EF 55-60%, mild MR, mild TR, mild AI, mild Ao sclerosis.   Myoview 08/2008 Anterolateral ischemia, EF 57%.   August 27, 2017:  Still very active.  .   No angina  BP and HR are good.   On crestor    Past Medical History:  Diagnosis Date  . Bradycardia    a. event monitor (8/14):  NSR, sinus brady, very frequent PACs and occasional atrial runs (3-4 beats).  No significant pauses.  [place] Kitchen  Clavicle fracture    left  . Coronary artery disease    a. s/p CABG 1994 // b. Abnl MV 08/2008: Ant-lat ischemia, EF 57%=>LHC 7/10: Xience DES x2 to bifurc lesion of LCx/OM // c. Abnl MV 08/2012-> cath with interval occ of SVG-RCA, CTO of LAD/RCA, severe prox LCx stenosis s/p FFR-guided DES to LCx // e. LHC 8/17: LM stent ok, oLAD 100, dLAD 45, RIMA-LAD ok, oLCx stent ok, OM1 stent ok, oRCA 100, S-RCA 100, L-R collats, EF 45-50 >> med Rx  . GERD (gastroesophageal reflux disease)   . Hyperlipidemia   . Hypertension    mild  hypertension  . Ischemic cardiomyopathy    a. Echo 9/17: EF 40-45%, anteroseptal HK, grade 2 diastolic dysfunction, mild AI, mild LAE  . Thrombocytopenia ([place])    a. Noted on labs 08/2012.    Past Surgical History:  Procedure Laterality Date  . CARDIAC CATHETERIZATION     His most recent in August of 2010 showed totally occluded LAD,  . CARDIAC CATHETERIZATION N/A 10/14/2015   Procedure: Left Heart Cath and Coronary Angiography;  Surgeon: Leonie Man, MD;  Location: [facility];  Service: Cardiovascular;  Laterality: N/A;  . COLONOSCOPY    . CORONARY ANGIOPLASTY    . CORONARY ARTERY BYPASS GRAFT  1993   RIMA-LAD, SVG-1ST, 2ND DIAG, SVG-1ST, 2 ND OM, PD  . CORONARY STENT PLACEMENT  7/10   Stent- Left Cirx  . FRACTIONAL FLOW RESERVE WIRE N/A 08/29/2012   Procedure: FRACTIONAL FLOW RESERVE WIRE;  Surgeon: Sherren Mocha, MD;  [facility];  Service: Cardiovascular;  Laterality: N/A;  . FRACTURE SURGERY     right  . ORIF CLAVICULAR FRACTURE Left 05/21/2014   Procedure: OPEN REDUCTION INTERNAL FIXATION (ORIF) CLAVICULAR FRACTURE;  Surgeon: Tania Ade, MD;  Location: [facility];  Service: Orthopedics;  Laterality: Left;  Open reduction internal fixation clavical fracture  . US ECHOCARDIOGRAPHY  10-29-2007   EF 55-60%    Current Medications: Current Meds  Medication Sig  . aspirin EC 81 MG tablet Take 81 mg by mouth daily.  . clopidogrel (PLAVIX) 75 MG tablet Take 75 mg by mouth daily.  . isosorbide mononitrate (IMDUR) 30 MG 24 hr tablet TAKE 0.5 TABLETS (15 MG TOTAL) BY MOUTH DAILY  . lisinopril (PRINIVIL,ZESTRIL) 5 MG tablet Take 5 mg by mouth every evening.  . nitroGLYCERIN (NITROSTAT) 0.4 MG SL tablet Place 1 tablet (0.4 mg total) under the tongue every 5 (five) minutes as needed for chest pain.  . rosuvastatin (CRESTOR) 20 MG tablet Take 20 mg by mouth daily.  . [DISCONTINUED] nitroGLYCERIN (NITROSTAT) 0.4 MG SL tablet Place 1 tablet (0.4 mg total) under the tongue  every 5 (five) minutes as needed for chest pain.  . [DISCONTINUED] pindolol (VISKEN) 5 MG tablet Take 0.5 tablets (2.5 mg total) by mouth daily.     Allergies:   Lipitor [atorvastatin]   Social History   Tobacco Use  . Smoking status: Former Smoker    Last attempt to quit: 02/14/1963    Years since quitting: 54.5  . Smokeless tobacco: Never Used  Substance Use Topics  . Alcohol use: No  . Drug use: No     Family Hx: The patient's family history includes Heart disease in his brother; Hypertension in his other; Stroke in his other.  ROS:   Please see the history of present illness.    ROS All other systems reviewed and are negative.   EKGs/Labs/Other Test Reviewed:  EKG:   .   Recent Labs: 10/23/2016: ALT 14   Recent Lipid Panel Lab Results  Component Value Date/Time   CHOL 114 10/23/2016 08:34 AM   TRIG 96 10/23/2016 08:34 AM   HDL 43 10/23/2016 08:34 AM   CHOLHDL 2.7 10/23/2016 08:34 AM   CHOLHDL 3 07/09/2012 07:45 AM   LDLCALC 52 10/23/2016 08:34 AM    Physical Exam:    Physical Exam: Blood pressure 112/70, pulse (!) 49, height 6' (1.829 m), weight 189 lb 12.8 oz (86.1 kg), SpO2 97 %.  GEN:  Well nourished, well developed in no acute distress HEENT: Normal NECK: No JVD; No carotid bruits LYMPHATICS: No lymphadenopathy CARDIAC: RR, no murmurs, rubs, gallops RESPIRATORY:  Clear to auscultation without rales, wheezing or rhonchi  ABDOMEN: Soft, non-tender, non-distended MUSCULOSKELETAL:  No edema; No deformity  SKIN: Warm and dry NEUROLOGIC:  Alert and oriented x 3   ASSESSMENT:    1. Hypercholesterolemia   2. Coronary artery disease involving native coronary artery of native heart without angina pectoris    PLAN:    In order of problems listed above:  1.  Coronary artery disease s/pCABG in 1994, DES 2 to the LCx/OM bifurcation in 2010and DES 1 to the ostial LCx in 2014.He has 3 out of 4 grafts known to be occluded.  Cardiac catheterization  in 09/2015 demonstrated patent stents in the left main, LCx and OM and patent RIMA-LAD. He has extensive left to right collaterals.  No angina  Does not want to take Pindolol .   Does not think its helping . Will DC pindolol    2. Ischemic cardiomyopathy EF 40-45 by echo in 9/17.    No dyspnea,  No signs of volume excess   3. Essential hypertension  doing well.   Continue meds except pindolol   4. Hyperlipidemia  check labs today       Medication Adjustments/Labs and Tests Ordered: Current medicines are reviewed at length with the patient today.  Concerns regarding medicines are outlined above.  Tests Ordered: No orders of the defined types were placed in this encounter.  Medication Changes: Meds ordered this encounter  Medications  . nitroGLYCERIN (NITROSTAT) 0.4 MG SL tablet    Sig: Place 1 tablet (0.4 mg total) under the tongue every 5 (five) minutes as needed for chest pain.    Dispense:  25 tablet    Refill:  3    Signed, Mertie Moores, MD  08/27/2017 11:16 AM    [facility] [address], [place], [place]  [address] Phone: [phone]; Fax: [phone]

## 2017-11-13 ENCOUNTER — Other Ambulatory Visit: Payer: Self-pay | Admitting: Physician Assistant

## 2017-11-13 DIAGNOSIS — I2510 Atherosclerotic heart disease of native coronary artery without angina pectoris: Secondary | ICD-10-CM

## 2018-01-26 ENCOUNTER — Other Ambulatory Visit: Payer: Self-pay | Admitting: Cardiovascular Disease

## 2018-02-25 NOTE — Progress Notes (Signed)
Cardiology Office Note:    Date:  02/26/2018   ID:  Brett Chambers, DOB 03/01/1940, MRN [id number]  PCP:  [name], MD  Cardiologist:  Mertie Moores, MD   Electrophysiologist:  None   Referring MD: [name], MD   Chief Complaint  Patient presents with  . Follow-up    CAD     History of Present Illness:    Brett Chambers is a 78 y.o. male with CAD, s/p CABG in 1994, HTN, HL. LHC in 08/2008 demonstrated 2/4 grafts patent with high grade disease at the bifurcation of the CFX and OM.He underwent PCIwith a Xience DES x2.In 7/14, the patient complained of chest discomfort. Stress testing was high risk with anterolateral ischemia. LHC in 7/14 demonstrated occlusion of the SVG-RCA and severe proximal LCx stenosis. He underwent FFR guided PCI with Xience DES to the ostial LCx. PCI of the extensive CTO of the RCA would be technically challenging and this was treated medically.In 8/17, he developed progressively worsening dyspnea on exertion and LHCdemonstrated patent left main, LCx and OM stents as well as patent RIMA-LAD. SVG-RCA was known to be occluded. EF was 45-50%. Medical therapy was continued. Echocardiogram demonstrated EF 40-45% with anteroseptal hypokinesis, mild aortic insufficiency and moderate diastolic dysfunction.He has been intolerant of beta-blockers in the past due to bradycardia and dizziness.  We placed him on Pindolol last year and he had been able to tolerate this drug.  However, he requested to stop it at his last visit with Dr. [name] in 7/19.      Mr. [name] returns for follow-up.  He is here with his wife.  Since last seen, he denies chest discomfort, significant shortness of breath, syncope, paroxysmal internal dyspnea or lower extremity swelling.  He decided to continue on pindolol and wanted to discuss it today.  Prior CV studies:   The following studies were reviewed today:  Echo 10/22/15 EF 40-45%, ant-septal HK, Gr 2 DD, mild AI, mild LAE  LHC  10/14/15 LM - stent patent LAD ost 100% CTO, dist 45% LCx ostial /prox stent patent with 5% ISR, mid stent patent, OM1 stent patent with 5% ISR, then 30% RCA - ostial 100% CTO with L-R collaterals to RPDA S-RCA 100% RIMA-LAD ok EF 45-50% No change c/w 7/14>>Med Rx  Carotid US 6/17 < 40% bilateral ICA stenosis  Event monitor 8/14: NSR, sinus brady, very frequent PACs and occasional atrial runs (3-4 beats).No significant pauses  LHC 08/29/12: LM and oCFX junction 75% LAD ostial occluded CFX ostial 75-80%, CFX stents patent with 50% ISR at prox edge, 40% before stented segment in mid CFX RCA proximal occluded S-OM occluded S-Dx occluded S-RCA occluded new RIMA-LAD patent EF 55%. FFR guided PCI: Xience DES to the ostial CFX.  Myoview 08/2012 High risk stress nuclear study Marked ECG abnormalities Inferior wall infarct base and mid level with severe ischemia in moderate anterolateral sized area from apex to base.  Echo 9/09:  EF 55-60%, mild MR, mild TR, mild AI, mild Ao sclerosis.   Myoview 08/2008 Anterolateral ischemia, EF 57%.  Past Medical History:  Diagnosis Date  . Bradycardia    a. event monitor (8/14):  NSR, sinus brady, very frequent PACs and occasional atrial runs (3-4 beats).  No significant pauses.  . Clavicle fracture    left  . Coronary artery disease    a. s/p CABG 1994 // b. Abnl MV 08/2008: Ant-lat ischemia, EF 57%=>LHC 7/10: Xience DES x2 to bifurc lesion of LCx/OM // c.  Abnl MV 08/2012-> cath with interval occ of SVG-RCA, CTO of LAD/RCA, severe prox LCx stenosis s/p FFR-guided DES to LCx // e. LHC 8/17: LM stent ok, oLAD 100, dLAD 45, RIMA-LAD ok, oLCx stent ok, OM1 stent ok, oRCA 100, S-RCA 100, L-R collats, EF 45-50 >> med Rx  . GERD (gastroesophageal reflux disease)   . Hyperlipidemia   . Hypertension    mild hypertension  . Ischemic cardiomyopathy    a. Echo 9/17: EF 40-45%, anteroseptal HK, grade 2 diastolic dysfunction, mild AI, mild LAE   . Thrombocytopenia ([place])    a. Noted on labs 08/2012.   Surgical Hx: The patient  has a past surgical history that includes Coronary artery bypass graft (1993); Coronary angioplasty; Coronary stent placement (7/10); Cardiac catheterization; US ECHOCARDIOGRAPHY (10-29-2007); Fractional flow reserve wire (N/A, 08/29/2012); Colonoscopy; Fracture surgery; ORIF clavicular fracture (Left, 05/21/2014); and Cardiac catheterization (N/A, 10/14/2015).   Current Medications: Current Meds  Medication Sig  . aspirin EC 81 MG tablet Take 81 mg by mouth daily.  . clopidogrel (PLAVIX) 75 MG tablet Take 1 tablet (75 mg total) by mouth daily.  . isosorbide mononitrate (IMDUR) 30 MG 24 hr tablet Take 0.5 tablets (15 mg total) by mouth daily.  Marland Kitchen lisinopril (PRINIVIL,ZESTRIL) 5 MG tablet TAKE 1 TABLET BY MOUTH IN THE EVENING  . nitroGLYCERIN (NITROSTAT) 0.4 MG SL tablet Place 1 tablet (0.4 mg total) under the tongue every 5 (five) minutes as needed for chest pain.  . pindolol (VISKEN) 5 MG tablet Take 2.5 mg by mouth daily.  . rosuvastatin (CRESTOR) 20 MG tablet Take 20 mg by mouth daily.  . [DISCONTINUED] clopidogrel (PLAVIX) 75 MG tablet Take 75 mg by mouth daily.     Allergies:   Lipitor [atorvastatin]   Social History   Tobacco Use  . Smoking status: Former Smoker    Last attempt to quit: 02/14/1963    Years since quitting: 55.0  . Smokeless tobacco: Never Used  Substance Use Topics  . Alcohol use: No  . Drug use: No     Family Hx: The patient's family history includes Heart disease in his brother; Hypertension in an other family member; Stroke in an other family member.  ROS:   Please see the history of present illness.    Review of Systems  Respiratory: Positive for cough.   Hematologic/Lymphatic: Bruises/bleeds easily.   All other systems reviewed and are negative.   EKGs/Labs/Other Test Reviewed:    EKG:  EKG is   ordered today.  The ekg ordered today demonstrates sinus bradycardia, heart  rate 53, normal axis, PACs, QTC 401  Recent Labs: No results found for requested labs within last 8760 hours.   Recent Lipid Panel Lab Results  Component Value Date/Time   CHOL 114 10/23/2016 08:34 AM   TRIG 96 10/23/2016 08:34 AM   HDL 43 10/23/2016 08:34 AM   CHOLHDL 2.7 10/23/2016 08:34 AM   CHOLHDL 3 07/09/2012 07:45 AM   LDLCALC 52 10/23/2016 08:34 AM   From [facility] Tool: Cholesterol, total 147.000 m 07/05/2017 HDL 34 MG/DL 07/05/2017 LDL 86.000 mg 07/05/2017 Triglycerides 135.000 07/05/2017 Hemoglobin 13.900 g/ 07/05/2017 Creatinine, Serum 1.000 mg/ 07/05/2017 Potassium 3.800 10/12/2015 ALT (SGPT) 17.000 uni 07/05/2017 TSH 4.010 07/05/2017 BNP 299.800 10/12/2015 INR 1.000 10/12/2015 Platelets 121.000 10/12/2015   Physical Exam:    VS:  BP 118/70   Pulse (!) 53   Ht 6' (1.829 m)   Wt 197 lb 12.8 oz (89.7 kg)   SpO2 95%  BMI 26.83 kg/m     Wt Readings from Last 3 Encounters:  02/26/18 197 lb 12.8 oz (89.7 kg)  08/27/17 189 lb 12.8 oz (86.1 kg)  02/19/17 199 lb 1.9 oz (90.3 kg)     Physical Exam  Constitutional: He is oriented to person, place, and time. He appears well-developed and well-nourished. No distress.  HENT:  Head: Normocephalic and atraumatic.  Eyes: No scleral icterus.  Neck: No JVD present. No thyromegaly present.  Cardiovascular: Normal rate and regular rhythm.  No murmur heard. Pulmonary/Chest: Effort normal. He has no wheezes. He has no rales.  Abdominal: Soft. He exhibits no distension.  Musculoskeletal:        General: No edema.  Lymphadenopathy:    He has no cervical adenopathy.  Neurological: He is alert and oriented to person, place, and time.  Skin: Skin is warm and dry.  Psychiatric: He has a normal mood and affect.    ASSESSMENT & PLAN:    Coronary artery disease involving native coronary artery of native heart with angina pectoris ([place]) s/pCABG in 1994, DES 2 to the LCx/OM bifurcation in 2010and DES 1 to the ostial LCx in  2014.He has 3 out of 4 grafts known to be occluded.Cardiac catheterization in 09/2015 demonstrated patent stents in the left main, LCx and OM and patent RIMA-LAD. He has extensive left to right collaterals.  He is not having any symptoms of recurrent angina.  Continue current medical regimen which includes aspirin, clopidogrel, isosorbide, pindolol.  Ischemic cardiomyopathy EF 40-45 by echo in September 2017.  We discussed the pros and cons of remaining on low-dose beta-blocker therapy.  He seems to be tolerating pindolol.  Therefore, he will remain on pindolol, lisinopril, isosorbide.  Essential hypertension The patient's blood pressure is controlled on his current regimen.  Continue current therapy.   Hyperlipidemia with target LDL less than 70 LDL in May 2019 was 86.  Continue current dose of rosuvastatin.  If his LDL continues to remain above 70 or increases, consider increasing rosuvastatin to 40 mg daily.   Dispo:  Return in about 6 months (around 08/27/2018) for Routine Follow Up, w/ Dr. [name], or [name], PA-C.   Medication Adjustments/Labs and Tests Ordered: Current medicines are reviewed at length with the patient today.  Concerns regarding medicines are outlined above.  Tests Ordered: Orders Placed This Encounter  Procedures  . EKG 12-Lead   Medication Changes: Meds ordered this encounter  Medications  . clopidogrel (PLAVIX) 75 MG tablet    Sig: Take 1 tablet (75 mg total) by mouth daily.    Dispense:  90 tablet    Refill:  3    Signed, [name], PA-C  02/26/2018 9:24 AM    [facility] [address], [place], [place]  [address] Phone: [phone]; Fax: [phone]

## 2018-02-26 ENCOUNTER — Ambulatory Visit: Payer: Medicare HMO | Admitting: Physician Assistant

## 2018-02-26 ENCOUNTER — Encounter: Payer: Self-pay | Admitting: Physician Assistant

## 2018-02-26 VITALS — BP 118/70 | HR 53 | Ht 72.0 in | Wt 197.8 lb

## 2018-02-26 DIAGNOSIS — I10 Essential (primary) hypertension: Secondary | ICD-10-CM | POA: Diagnosis not present

## 2018-02-26 DIAGNOSIS — E785 Hyperlipidemia, unspecified: Secondary | ICD-10-CM

## 2018-02-26 DIAGNOSIS — I25119 Atherosclerotic heart disease of native coronary artery with unspecified angina pectoris: Secondary | ICD-10-CM | POA: Diagnosis not present

## 2018-02-26 DIAGNOSIS — I255 Ischemic cardiomyopathy: Secondary | ICD-10-CM

## 2018-02-26 MED ORDER — CLOPIDOGREL BISULFATE 75 MG PO TABS: 75.0000 mg | ORAL_TABLET | Freq: Every day | ORAL | 3 refills | Status: DC

## 2018-02-26 NOTE — Patient Instructions (Signed)
Medication Instructions:  Your physician recommends that you continue on your current medications as directed. Please refer to the Current Medication list given to you today.   If you need a refill on your cardiac medications before your next appointment, please call your pharmacy.   Lab work: NONE  If you have labs (blood work) drawn today and your tests are completely normal, you will receive your results only by: . MyChart Message (if you have MyChart) OR . A paper copy in the mail If you have any lab test that is abnormal or we need to change your treatment, we will call you to review the results.  Testing/Procedures: NONE  Follow-Up: At [facility], you and your health needs are our priority.  As part of our continuing mission to provide you with exceptional heart care, we have created designated Provider Care Teams.  These Care Teams include your primary Cardiologist (physician) and Advanced Practice Providers (APPs -  Physician Assistants and Nurse Practitioners) who all work together to provide you with the care you need, when you need it. . You will need a follow up appointment in:  6 months.  Please call our office 2 months in advance to schedule this appointment.  You may see Philip Nahser, MD or Scott Weaver, PA-C.   Any Other Special Instructions Will Be Listed Below (If Applicable).    

## 2018-03-05 DIAGNOSIS — E7849 Other hyperlipidemia: Secondary | ICD-10-CM | POA: Diagnosis not present

## 2018-03-18 DIAGNOSIS — R69 Illness, unspecified: Secondary | ICD-10-CM | POA: Diagnosis not present

## 2018-03-26 DIAGNOSIS — R69 Illness, unspecified: Secondary | ICD-10-CM | POA: Diagnosis not present

## 2018-05-13 ENCOUNTER — Other Ambulatory Visit: Payer: Self-pay | Admitting: Physician Assistant

## 2018-05-13 DIAGNOSIS — I2510 Atherosclerotic heart disease of native coronary artery without angina pectoris: Secondary | ICD-10-CM

## 2018-05-16 ENCOUNTER — Telehealth: Payer: Self-pay | Admitting: Physician Assistant

## 2018-05-16 NOTE — Telephone Encounter (Signed)
[facility] is requesting an alternative for Pindolol 5 mg tablet. Pharmacy is stating that this medication is on backorder and wanted to know if an alternative could be sent in so pt could bet his medication. Please address

## 2018-05-16 NOTE — Telephone Encounter (Signed)
Can we see if he can get it from another pharmacy? There is no real alternative for him with this drug. If there is no way to get it, he should keep an eye on his BP until he can get it filled. If BP > 130/80, call our office. [name], PA-C    05/16/2018 1:53 PM

## 2018-05-16 NOTE — Telephone Encounter (Signed)
Called pt and left message asking pt to call back to see about sending his Rx to a different pharmacy that has his medication Pindolol 5 mg tablet in stock, which [facility] [address] [facility] [address]. Both pharmacies have this medication in stock.

## 2018-05-17 ENCOUNTER — Other Ambulatory Visit: Payer: Self-pay | Admitting: *Deleted

## 2018-05-17 MED ORDER — PINDOLOL 5 MG PO TABS: 5.0000 mg | ORAL_TABLET | Freq: Every day | ORAL | 3 refills | Status: DC

## 2018-05-17 NOTE — Progress Notes (Signed)
SPOKE WITH PT WAS AGREEABLE TO GO WITH [facility] [address]. [name].   PT ALSO RECOMMENDED TO KEEP AN EYE ON BLOOD PRESSURE AND TO CONTACT OFFICE IF BLOOD PRESSURES CONSISTENTLY RUNNING OVER  130'S AND 80'S

## 2018-05-21 NOTE — Telephone Encounter (Signed)
Pt's medication was sent to pt's pharmacy. Confirmation received °

## 2018-06-04 ENCOUNTER — Telehealth: Payer: Self-pay | Admitting: Physician Assistant

## 2018-06-04 NOTE — Telephone Encounter (Signed)
Called patient at home. He has been lightheaded today.  It has been worse with standing.  He worked outside for a few hours.  He has something similar happen several weeks ago. He has not passed out.  No chest pain, shortness of breath.  No fevers, vomiting, diarrhea.  No bleeding issues.  PLAN: Hold Lisinopril and Pindolol tonight. Do not take any meds in AM Check BP several times and call us with readings mid day Liberalize salt tonight and use caution. He verbalized understanding. [name], PA-C    06/04/2018 5:26 PM

## 2018-06-04 NOTE — Telephone Encounter (Signed)
Spoke with patient's daughter, Hritz, who states patient has lightheadedness and dizziness, especially when he goes from sitting to standing. States an episode occurred today when patient was getting out of the car and when he checked BP it was 74/50 mmHg; could not measure HR but his resting HR is approximately 55 bpm. Having episodes more frequently and daughter states this is frightening to the patient who rarely complains. States he is careful about sodium in diet and is very active with exercise and yard work. I advised that patient may hold Lisinopril tomorrow morning and that I will forward message to [name], PA for advice.  Rea states Nicki Reaper may call to discuss with her or can call the patient directly. She thanked me for the call.

## 2018-06-04 NOTE — Telephone Encounter (Signed)
Follow up   Patient's daughter got disconnected on the call per the previous message. Please call her back.

## 2018-06-04 NOTE — Telephone Encounter (Signed)
New message   Pt c/o Syncope: STAT if syncope occurred within 30 minutes and pt complains of lightheadedness High Priority if episode of passing out, completely, today or in last 24 hours   1. Did you pass out today? No   2. When is the last time you passed out? Patient's daughter  is going from sitting to standing and he gets lightheaded.  3. Has this occurred multiple times? yes  4. Did you have any symptoms prior to passing out? No

## 2018-07-10 DIAGNOSIS — E7849 Other hyperlipidemia: Secondary | ICD-10-CM | POA: Diagnosis not present

## 2018-07-10 DIAGNOSIS — I10 Essential (primary) hypertension: Secondary | ICD-10-CM | POA: Diagnosis not present

## 2018-08-05 ENCOUNTER — Telehealth: Payer: Self-pay | Admitting: Cardiovascular Disease

## 2018-08-05 NOTE — Telephone Encounter (Signed)
Spoke with the patient, he stated he has been having chest discomfort and stabbing pain around a second since about March. He states it occurring during relaxation and ambulation at random. He said it occurs some days and some not. His main concern was he has had 3 events one in March, April and May, where the room was spinning and his vision became blurry. He stated he felt like he was going to pass out. He took his blood pressure during the May event and saw it was 117/60s. He denied SOB and edema.   Spoke with [name], PA-C, he stated that patient needed to come into the office to be evaluated. The patient accepted coming on 08/07/18. He had no further questions.

## 2018-08-05 NOTE — Telephone Encounter (Signed)
New message     Pt c/o of Chest Pain: STAT if CP now or developed within 24 hours  1. Are you having CP right now? No   2. Are you experiencing any other symptoms (ex. SOB, nausea, vomiting, sweating)?no   3. How long have you been experiencing CP? Per patient's daughter daughter states that having chest pains since March  4. Is your CP continuous or coming and going?coming and going  5. Have you taken Nitroglycerin? No  ?

## 2018-08-07 ENCOUNTER — Ambulatory Visit: Payer: Medicare HMO | Admitting: Physician Assistant

## 2018-08-07 ENCOUNTER — Encounter: Payer: Self-pay | Admitting: Physician Assistant

## 2018-08-07 ENCOUNTER — Other Ambulatory Visit: Payer: Self-pay

## 2018-08-07 ENCOUNTER — Encounter: Payer: Self-pay | Admitting: *Deleted

## 2018-08-07 VITALS — BP 132/70 | HR 67 | Ht 72.0 in | Wt 198.0 lb

## 2018-08-07 DIAGNOSIS — E785 Hyperlipidemia, unspecified: Secondary | ICD-10-CM

## 2018-08-07 DIAGNOSIS — I255 Ischemic cardiomyopathy: Secondary | ICD-10-CM | POA: Diagnosis not present

## 2018-08-07 DIAGNOSIS — I10 Essential (primary) hypertension: Secondary | ICD-10-CM

## 2018-08-07 DIAGNOSIS — I493 Ventricular premature depolarization: Secondary | ICD-10-CM

## 2018-08-07 DIAGNOSIS — I25119 Atherosclerotic heart disease of native coronary artery with unspecified angina pectoris: Secondary | ICD-10-CM | POA: Diagnosis not present

## 2018-08-07 DIAGNOSIS — R55 Syncope and collapse: Secondary | ICD-10-CM | POA: Diagnosis not present

## 2018-08-07 DIAGNOSIS — R0789 Other chest pain: Secondary | ICD-10-CM

## 2018-08-07 NOTE — Progress Notes (Signed)
Cardiology Office Note:    Date:  08/07/2018   ID:  Brett Chambers, DOB 28-Mar-1940, MRN [id number]  PCP:  Brett Redwood, MD  Cardiologist:  Brett Moores, MD / Brett Dopp, PA-C  Electrophysiologist:  None   Referring MD: Brett Redwood, MD   Chief Complaint  Patient presents with  . Dizziness  . Chest Pain    History of Present Illness:    Brett Chambers is a 78 y.o. male with:    CAD, s/p CABG in 1994,   [place] in 08/2008: 2/4 grafts patent with high grade disease at the bifurcation of the CFX and OM >> PCI:  Xience DES x2.  Myoview 7/14: +ant-lat ischemia >> LHC: occlusion of the SVG-RCA and severe proximal LCx stenosis >> FFR guided PCI with Xience DES to the ostial LCx.   PCI of the extensive CTO of the RCA would be technically challenging and this was treated medically.  LHC 8/17:  patent LM, LCx and OM stents as well as patent RIMA-LAD. SVG-RCA was known to be occluded. EF was 45-50%. Medical therapy was continued.   Ischemic CM with EF 40-45  Hypertension    Hyperlipidemia   Bradycardia  Intol of beta-blocker in past; has been able to tolerate Pindolol since 2019  [name] was last seen in Jan 2020.  He called in recently with chest pain and dizziness and is added on for evaluation.  He is here alone.  Since he was last seen, he has had about 3 episodes of near syncope.  These always occur while seated.  He does describe some spinning but mainly describes near syncope.  He has also had some occasional left-sided sharp chest pain.  This only lasts for a second or less.  It is not exertional and he does not take nitroglycerin.  He has not been short of breath but does note decreased energy.  He has not had frank syncope.  He has not had orthopnea or lower extremity swelling.  He denies melena or hematochezia.  Prior CV studies:   The following studies were reviewed today:  Echo 10/22/15 EF 40-45%, ant-septal HK, Gr 2 DD, mild AI, mild LAE  LHC 10/14/15 LM - stent  patent LAD ost 100% CTO, dist 45% LCx ostial /prox stent patent with 5% ISR, mid stent patent, OM1 stent patent with 5% ISR, then 30% RCA - ostial 100% CTO with L-R collaterals to RPDA S-RCA 100% RIMA-LAD ok EF 45-50% No change c/w 7/14>>Med Rx  Carotid US 6/17 < 40% bilateral ICA stenosis  Event monitor 8/14: NSR, sinus brady, very frequent PACs and occasional atrial runs (3-4 beats).No significant pauses  LHC 08/29/12: LM and oCFX junction 75% LAD ostial occluded CFX ostial 75-80%, CFX stents patent with 50% ISR at prox edge, 40% before stented segment in mid CFX RCA proximal occluded S-OM occluded S-Dx occluded S-RCA occluded new RIMA-LAD patent EF 55%. FFR guided PCI: Xience DES to the ostial CFX.  Myoview 08/2012 High risk stress nuclear study Marked ECG abnormalities Inferior wall infarct base and mid level with severe ischemia in moderate anterolateral sized area from apex to base.  Echo 9/09:  EF 55-60%, mild MR, mild TR, mild AI, mild Ao sclerosis.   Myoview 08/2008 Anterolateral ischemia, EF 57%.  Past Medical History:  Diagnosis Date  . Bradycardia    a. event monitor (8/14):  NSR, sinus brady, very frequent PACs and occasional atrial runs (3-4 beats).  No significant pauses.  . Clavicle fracture  left  . Coronary artery disease    a. s/p CABG 1994 // b. Abnl MV 08/2008: Ant-lat ischemia, EF 57%=>LHC 7/10: Xience DES x2 to bifurc lesion of LCx/OM // c. Abnl MV 08/2012-> cath with interval occ of SVG-RCA, CTO of LAD/RCA, severe prox LCx stenosis s/p FFR-guided DES to LCx // e. LHC 8/17: LM stent ok, oLAD 100, dLAD 45, RIMA-LAD ok, oLCx stent ok, OM1 stent ok, oRCA 100, S-RCA 100, L-R collats, EF 45-50 >> med Rx  . GERD (gastroesophageal reflux disease)   . Hyperlipidemia   . Hypertension    mild hypertension  . Ischemic cardiomyopathy    a. Echo 9/17: EF 40-45%, anteroseptal HK, grade 2 diastolic dysfunction, mild AI, mild LAE  . Thrombocytopenia  ([place])    a. Noted on labs 08/2012.   Surgical Hx: The patient  has a past surgical history that includes Coronary artery bypass graft (1993); Coronary angioplasty; Coronary stent placement (7/10); Cardiac catheterization; US ECHOCARDIOGRAPHY (10-29-2007); Fractional flow reserve wire (N/A, 08/29/2012); Colonoscopy; Fracture surgery; ORIF clavicular fracture (Left, 05/21/2014); and Cardiac catheterization (N/A, 10/14/2015).   Current Medications: Current Meds  Medication Sig  . aspirin EC 81 MG tablet Take 81 mg by mouth daily.  . clopidogrel (PLAVIX) 75 MG tablet Take 1 tablet (75 mg total) by mouth daily.  . isosorbide mononitrate (IMDUR) 30 MG 24 hr tablet Take 1 tablet by mouth once daily  . lisinopril (PRINIVIL,ZESTRIL) 5 MG tablet TAKE 1 TABLET BY MOUTH IN THE EVENING  . nitroGLYCERIN (NITROSTAT) 0.4 MG SL tablet Place 1 tablet (0.4 mg total) under the tongue every 5 (five) minutes as needed for chest pain.  . pindolol (VISKEN) 5 MG tablet Take 1 tablet (5 mg total) by mouth daily.  . rosuvastatin (CRESTOR) 20 MG tablet Take 20 mg by mouth daily.     Allergies:   Lipitor [atorvastatin]   Social History   Tobacco Use  . Smoking status: Former Smoker    Quit date: 02/14/1963    Years since quitting: 55.5  . Smokeless tobacco: Never Used  Substance Use Topics  . Alcohol use: No  . Drug use: No     Family Hx: The patient's family history includes Heart disease in his brother; Hypertension in an other family member; Stroke in an other family member.  ROS:   Please see the history of present illness.    ROS All other systems reviewed and are negative.   EKGs/Labs/Other Test Reviewed:    EKG:  EKG is  ordered today.  The ekg ordered today demonstrates sinus rhythm, heart rate 63, frequent unifocal PVCs, QTC 440  Recent Labs: No results found for requested labs within last 8760 hours.   Recent Lipid Panel Lab Results  Component Value Date/Time   CHOL 114 10/23/2016 08:34 AM    TRIG 96 10/23/2016 08:34 AM   HDL 43 10/23/2016 08:34 AM   CHOLHDL 2.7 10/23/2016 08:34 AM   CHOLHDL 3 07/09/2012 07:45 AM   LDLCALC 52 10/23/2016 08:34 AM     Physical Exam:    VS:  BP 132/70   Pulse 67   Ht 6' (1.829 m)   Wt 198 lb (89.8 kg)   BMI 26.85 kg/m     Wt Readings from Last 3 Encounters:  08/07/18 198 lb (89.8 kg)  02/26/18 197 lb 12.8 oz (89.7 kg)  08/27/17 189 lb 12.8 oz (86.1 kg)     Physical Exam  Constitutional: He is oriented to person, place, and time. He  appears well-developed and well-nourished. No distress.  HENT:  Head: Normocephalic and atraumatic.  Neck: No JVD present. No thyromegaly present.  Cardiovascular: Normal rate, regular rhythm and normal heart sounds.  No murmur heard. Pulmonary/Chest: Effort normal and breath sounds normal. He has no rales.  Abdominal: Soft. There is no hepatomegaly.  Musculoskeletal:        General: No edema.  Lymphadenopathy:    He has no cervical adenopathy.  Neurological: He is alert and oriented to person, place, and time.  Skin: Skin is warm and dry.  Psychiatric: He has a normal mood and affect.    ASSESSMENT & PLAN:    1. Near syncope He has had several episodes of near syncope over the past few months.  Question if he has had sinus pauses versus symptomatic bradycardia.  He has had issues with bradycardia in the past.  His ventricular rate on EKG today is approximately 60.  His ECG does demonstrate several PVCs.  He notes some decreased energy as well as occasional atypical chest pain.  He has had recent blood work with primary care.    -Obtain recent labs from PCP to rule out hypokalemia, anemia  -Obtain ZIO AT to rule out sinus pauses, bradycardia arrhythmias, NSVT + assess PVC burden.  -Obtain echocardiogram to rule out worsening LV function.  2. Other chest pain His chest symptoms are not typical for ischemia.  However, he has not typically had classic anginal symptoms.  His last stress test was in  2014 prior to his last PCI.  He had a large area of ischemia in the ant-lat wall.  He does not really have any ischemic changes on his ECG.  However, he does have frequent PVCs as noted.  -Obtain echocardiogram as noted  -Arrange Lexiscan Myoview to rule out significant ischemia  -If Myoview high risk, proceed with cardiac catheterization  3. PVC's (premature ventricular contractions) He has had rare PVCs noted in the past.  He has 3 PVCs on his ECG today.  Obtain echocardiogram rule out worsening LV function.  Obtain Zio AT to rule out arrhythmias as well as to assess PVC burden.  4. Coronary artery disease involving native coronary artery of native heart with angina pectoris ([place]) s/pCABG in 1994, DES 2 to the LCx/OM bifurcation in 2010and DES 1 to the ostial LCx in 2014.He has 3 out of 4 grafts known to be occluded.Cardiac catheterization in 09/2015 demonstrated patent stents in the left main, LCx and OM and patent RIMA-LAD. He has extensive left to right collaterals.   Obtain Myoview as noted to rule out ischemia.  Continue aspirin, clopidogrel, isosorbide, beta-blocker.  If he has significant bradycardia arrhythmias on his monitor, I will stop his beta-blocker.  5. Ischemic cardiomyopathy EF 40-45 by echo in 2017.  As noted, repeat echo will be obtained.   6. Essential hypertension The patient's blood pressure is controlled on his current regimen.  Continue current therapy.    7. Hyperlipidemia with target LDL less than 70 LDL optimal on most recent lab work.  Continue current Rx.     Dispo:  Return in about 2 weeks (around 08/21/2018) for Close Follow Up, w/ Brett Dopp, PA-C.   Medication Adjustments/Labs and Tests Ordered: Current medicines are reviewed at length with the patient today.  Concerns regarding medicines are outlined above.  Tests Ordered: Orders Placed This Encounter  Procedures  . LONG TERM MONITOR-LIVE TELEMETRY (3-14 DAYS)  . MYOCARDIAL PERFUSION IMAGING   . EKG 12-Lead  . ECHOCARDIOGRAM  COMPLETE   Medication Changes: No orders of the defined types were placed in this encounter.   Signed, Brett Dopp, PA-C  08/07/2018 2:54 PM    [facility] [place], [place], [place]  [address] Phone: [phone]; Fax: [phone]

## 2018-08-07 NOTE — Patient Instructions (Addendum)
Medication Instructions:  Your physician recommends that you continue on your current medications as directed. Please refer to the Current Medication list given to you today.   If you need a refill on your cardiac medications before your next appointment, please call your pharmacy.   Lab work: NONE ORDERED  TODAY   If you have labs (blood work) drawn today and your tests are completely normal, you will receive your results only by: Marland Kitchen MyChart Message (if you have MyChart) OR . A paper copy in the mail If you have any lab test that is abnormal or we need to change your treatment, we will call you to review the results.  Testing/Procedures: Your physician has requested that you have an echocardiogram. Echocardiography is a painless test that uses sound waves to create images of your heart. It provides your doctor with information about the size and shape of your heart and how well your heart's chambers and valves are working. This procedure takes approximately one hour. There are no restrictions for this procedure.   Your physician has requested that you have a lexiscan myoview. For further information please visit [email or web address]. Please follow instruction sheet, as given.  Your physician has recommended that you wear an event monitor. Event monitors are medical devices that record the heart's electrical activity. Doctors most often [place] these monitors to diagnose arrhythmias. Arrhythmias are problems with the speed or rhythm of the heartbeat. The monitor is a small, portable device. You can wear one while you do your normal daily activities. This is usually used to diagnose what is causing palpitations/syncope (passing out).  Follow-Up: At [facility], you and your health needs are our priority.  As part of our continuing mission to provide you with exceptional heart care, we have created designated Provider Care Teams.  These Care Teams include your primary Cardiologist (physician) and  Advanced Practice Providers (APPs -  Physician Assistants and Nurse Practitioners) who all work together to provide you with the care you need, when you need it. You will need a follow up appointment in:  2-3  weeks.  Please call our office 2 months in advance to schedule this appointment.  You may see Mertie Moores, MD or one of the following Advanced Practice Providers on your designated Care Team: [name], PA-C [place], [place] . Daune Perch, NP  Any Other Special Instructions Will Be Listed Below (If Applicable).

## 2018-08-08 ENCOUNTER — Telehealth: Payer: Self-pay | Admitting: *Deleted

## 2018-08-08 NOTE — Telephone Encounter (Signed)
Order for 14 day long term live telemetry( ZIO AT), has been changed to a 30 day cardiac event monitor per [name], PA-C. Nuclear medicine or Echocardiogram cannot be performed while patient wearing  ZIO A non removable single patch system. [employer] to ship 30 day cardiac event monitor to the patients home.  Patient instructed to review instruction manual, apply monitor, turn monitor and cell phone on, and call [employer] at [phone] to send baseline recording.  [employer] will be able to answer any questions regarding monitor instructions at that time.

## 2018-08-14 ENCOUNTER — Encounter (INDEPENDENT_AMBULATORY_CARE_PROVIDER_SITE_OTHER): Payer: Medicare HMO

## 2018-08-14 DIAGNOSIS — I493 Ventricular premature depolarization: Secondary | ICD-10-CM

## 2018-08-14 DIAGNOSIS — R55 Syncope and collapse: Secondary | ICD-10-CM

## 2018-08-14 DIAGNOSIS — I499 Cardiac arrhythmia, unspecified: Secondary | ICD-10-CM | POA: Diagnosis not present

## 2018-08-15 ENCOUNTER — Telehealth (HOSPITAL_COMMUNITY): Payer: Self-pay | Admitting: *Deleted

## 2018-08-15 NOTE — Telephone Encounter (Signed)
Patient given detailed instructions per Myocardial Perfusion Study Information Sheet for the test on 08/21/18 at 7:15. Patient notified to arrive 15 minutes early and that it is imperative to arrive on time for appointment to keep from having the test rescheduled.  If you need to cancel or reschedule your appointment, please call the office within 24 hours of your appointment. . Patient verbalized understanding.Brett Chambers

## 2018-08-21 ENCOUNTER — Ambulatory Visit (HOSPITAL_BASED_OUTPATIENT_CLINIC_OR_DEPARTMENT_OTHER): Payer: Medicare HMO

## 2018-08-21 ENCOUNTER — Encounter (HOSPITAL_COMMUNITY): Payer: Self-pay

## 2018-08-21 ENCOUNTER — Other Ambulatory Visit: Payer: Self-pay

## 2018-08-21 ENCOUNTER — Ambulatory Visit (HOSPITAL_COMMUNITY): Payer: Medicare HMO | Attending: Cardiovascular Disease

## 2018-08-21 DIAGNOSIS — I25119 Atherosclerotic heart disease of native coronary artery with unspecified angina pectoris: Secondary | ICD-10-CM | POA: Diagnosis not present

## 2018-08-21 DIAGNOSIS — R55 Syncope and collapse: Secondary | ICD-10-CM | POA: Diagnosis not present

## 2018-08-21 DIAGNOSIS — R0789 Other chest pain: Secondary | ICD-10-CM

## 2018-08-21 LAB — MYOCARDIAL PERFUSION IMAGING
LV dias vol: 149 mL (ref 62–150)
LV sys vol: 76 mL
Peak HR: 66 {beats}/min
Rest HR: 60 {beats}/min
SDS: 5
SRS: 6
SSS: 9
TID: 1.07

## 2018-08-21 LAB — ECHOCARDIOGRAM COMPLETE
Height: 72 in
Weight: 3168 oz

## 2018-08-21 MED ORDER — TECHNETIUM TC 99M TETROFOSMIN IV KIT
10.6000 | PACK | Freq: Once | INTRAVENOUS | Status: AC | PRN
  Administered 2018-08-21: 10.6 via INTRAVENOUS
  Filled 2018-08-21: qty 11

## 2018-08-21 MED ORDER — REGADENOSON 0.4 MG/5ML IV SOLN
0.4000 mg | Freq: Once | INTRAVENOUS | Status: AC
  Administered 2018-08-21: 0.4 mg via INTRAVENOUS

## 2018-08-21 MED ORDER — PERFLUTREN LIPID MICROSPHERE
1.0000 mL | INTRAVENOUS | Status: AC | PRN
  Administered 2018-08-21: 1 mL via INTRAVENOUS

## 2018-08-21 MED ORDER — TECHNETIUM TC 99M TETROFOSMIN IV KIT
31.3000 | PACK | Freq: Once | INTRAVENOUS | Status: AC | PRN
  Administered 2018-08-21: 31.3 via INTRAVENOUS
  Filled 2018-08-21: qty 32

## 2018-08-28 ENCOUNTER — Telehealth: Payer: Self-pay | Admitting: Cardiovascular Disease

## 2018-08-28 NOTE — Telephone Encounter (Signed)

## 2018-08-29 NOTE — Progress Notes (Signed)
Cardiology Office Note:    Date:  08/30/2018   ID:  Brett Chambers, DOB [birth year], MRN [id number]  PCP:  [name], MD  Cardiologist:  Mertie Moores, MD   Referring MD: [name], MD   Chief Complaint  Patient presents with  . Coronary Artery Disease    Notes from [name], [place]    Brett Chambers is a 78 y.o. male with a hx of CAD, s/p CABG in 1994, HTN, HL. LHC in 08/2008 demonstrated 2/4 grafts patent with high grade disease at the bifurcation of the CFX and OM.He underwent PCIwith a Xience DES x2.In 7/14, the patient complained of chest discomfort. Stress testing was high risk with anterolateral ischemia. LHC in 7/14 demonstrated occlusion of the SVG-RCA and severe proximal LCx stenosis. He underwent FFR guided PCI with Xience DES to the ostial LCx. PCI of the extensive CTO of the RCA would be technically challenging and this was treated medically.In 8/17, he developed progressively worsening dyspnea on exertion and LHCdemonstrated patent left main, LCx and OM stents as well as patent RIMA-LAD. SVG-RCA was known to be occluded. EF was 45-50%. Medical therapy was continued. Echocardiogram demonstrated EF 40-45% with anteroseptal hypokinesis, mild aortic insufficiency and moderate diastolic dysfunction. He has been intolerant of beta-blockers in the past due to bradycardia and dizziness.  He was last seen in 6/18.  At that time, we decided to try him on Pindolol (due to its intrinsic sympathomimetic activity) to see if he could tolerate it.    Brett Chambers returns for follow-up.  He has had a couple of episodes of lower left-sided discomfort.  He describes this as sharp and it only lasts seconds.  It is not related to any activity.  He has not had any recurrent symptoms of angina.  He denies dyspnea, syncope, PND or significant edema.  He walks 3 miles several times a week.  Prior CV studies:   The following studies were reviewed today:  Echo 10/22/15 EF 40-45%, ant-septal HK, Gr 2  DD, mild AI, mild LAE  LHC 10/14/15 LM - stent patent LAD ost 100% CTO, dist 45% LCx ostial /prox stent patent with 5% ISR, mid stent patent, OM1 stent patent with 5% ISR, then 30% RCA - ostial 100% CTO with L-R collaterals to RPDA S-RCA 100% RIMA-LAD ok EF 45-50% No change c/w 7/14>>Med Rx   Carotid US 6/17 < 40% bilateral ICA stenosis  Event monitor 8/14: NSR, sinus brady, very frequent PACs and occasional atrial runs (3-4 beats).No significant pauses  LHC 08/29/12: LM and oCFX junction 75% LAD ostial occluded CFX ostial 75-80%, CFX stents patent with 50% ISR at prox edge, 40% before stented segment in mid CFX RCA proximal occluded S-OM occluded S-Dx occluded S-RCA occluded new RIMA-LAD patent EF 55%. FFR guided PCI: Xience DES to the ostial CFX.  Myoview 08/2012 High risk stress nuclear study Marked ECG abnormalities Inferior wall infarct base and mid level with severe ischemia in moderate anterolateral sized area from apex to base.  Echo 9/09:  EF 55-60%, mild MR, mild TR, mild AI, mild Ao sclerosis.   Myoview 08/2008 Anterolateral ischemia, EF 57%.   August 27, 2017:  Still very active.  .   No angina  BP and HR are good.   On crestor   August 30, 2018  Brett Chambers is seen today for follow-up of his coronary artery disease.  He has hyperlipidemia.  His heart rate was noted to be very low today.  EKG reveals that  he is in bigeminy.   Was seen by [name], PA several weeks ago Has had near syncope.  Is wearing a patch monitor  Has short pains .  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   Past Medical History:  Diagnosis Date  . Bradycardia    a. event monitor (8/14):  NSR, sinus brady, very frequent PACs and occasional atrial runs (3-4 beats).  No significant pauses.  . Clavicle fracture    left  . Coronary artery disease    a. s/p CABG 1994 // b. Abnl MV 08/2008: Ant-lat ischemia, EF 57%=>LHC  7/10: Xience DES x2 to bifurc lesion of LCx/OM // c. Abnl MV 08/2012-> cath with interval occ of SVG-RCA, CTO of LAD/RCA, severe prox LCx stenosis s/p FFR-guided DES to LCx // e. LHC 8/17: LM stent ok, oLAD 100, dLAD 45, RIMA-LAD ok, oLCx stent ok, OM1 stent ok, oRCA 100, S-RCA 100, L-R collats, EF 45-50 >> med Rx   . GERD (gastroesophageal reflux disease)   . Hyperlipidemia   . Hypertension    mild hypertension  . Ischemic cardiomyopathy    a. Echo 9/17: EF 40-45%, anteroseptal HK, grade 2 diastolic dysfunction, mild AI, mild LAE // Echo 08/2018: EF 50-55, normal diast function, no RWMA, RVSP 27.3, mild LAE, trivial AI, mild MR  . Myoview    Myoview 08/2018: EF 49, inf/inf-lat/apical inf scar; no ischemia; Low Risk  . Thrombocytopenia ([place])    a. Noted on labs 08/2012.    Past Surgical History:  Procedure Laterality Date  . CARDIAC CATHETERIZATION     His most recent in August of 2010 showed totally occluded LAD,  . CARDIAC CATHETERIZATION N/A 10/14/2015   Procedure: Left Heart Cath and Coronary Angiography;  Surgeon: Leonie Man, MD;  Location: [facility];  Service: Cardiovascular;  Laterality: N/A;  . COLONOSCOPY    . CORONARY ANGIOPLASTY    . CORONARY ARTERY BYPASS GRAFT  1993   RIMA-LAD, SVG-1ST, 2ND DIAG, SVG-1ST, 2 ND OM, PD  . CORONARY STENT PLACEMENT  7/10   Stent- Left Cirx  . FRACTIONAL FLOW RESERVE WIRE N/A 08/29/2012   Procedure: FRACTIONAL FLOW RESERVE WIRE;  Surgeon: Sherren Mocha, MD;  Location: [facility];  Service: Cardiovascular;  Laterality: N/A;  . FRACTURE SURGERY     right  . ORIF CLAVICULAR FRACTURE Left 05/21/2014   Procedure: OPEN REDUCTION INTERNAL FIXATION (ORIF) CLAVICULAR FRACTURE;  Surgeon: Tania Ade, MD;  Location: [facility];  Service: Orthopedics;  Laterality: Left;  Open reduction internal fixation clavical fracture  . US ECHOCARDIOGRAPHY  10-29-2007   EF 55-60%    Current Medications: Current Meds  Medication Sig  . aspirin EC 81 MG  tablet Take 81 mg by mouth daily.  . clopidogrel (PLAVIX) 75 MG tablet Take 1 tablet (75 mg total) by mouth daily.  . isosorbide mononitrate (IMDUR) 30 MG 24 hr tablet Take 1 tablet by mouth once daily  . lisinopril (PRINIVIL,ZESTRIL) 5 MG tablet TAKE 1 TABLET BY MOUTH IN THE EVENING  . nitroGLYCERIN (NITROSTAT) 0.4 MG SL tablet Place 1 tablet (0.4 mg total) under the tongue every 5 (five) minutes as needed for chest pain.  . pindolol (VISKEN) 5 MG tablet Take 1 tablet (5 mg total) by mouth daily.  . rosuvastatin (CRESTOR) 20 MG tablet Take 20 mg by mouth daily.  . [DISCONTINUED] nitroGLYCERIN (NITROSTAT) 0.4 MG SL tablet Place 1 tablet (0.4  mg total) under the tongue every 5 (five) minutes as needed for chest pain.     Allergies:   Lipitor [atorvastatin]   Social History   Tobacco Use  . Smoking status: Former Smoker    Quit date: 02/14/1963    Years since quitting: 55.5  . Smokeless tobacco: Never Used  Substance Use Topics  . Alcohol use: No  . Drug use: No     Family Hx: The patient's family history includes Heart disease in his brother; Hypertension in an other family member; Stroke in an other family member.  ROS:   Please see the history of present illness.    ROS All other systems reviewed and are negative.   EKGs/Labs/Other Test Reviewed:    EKG:   .   Recent Labs: No results found for requested labs within last 8760 hours.   Recent Lipid Panel Lab Results  Component Value Date/Time   CHOL 114 10/23/2016 08:34 AM   TRIG 96 10/23/2016 08:34 AM   HDL 43 10/23/2016 08:34 AM   CHOLHDL 2.7 10/23/2016 08:34 AM   CHOLHDL 3 07/09/2012 07:45 AM   LDLCALC 52 10/23/2016 08:34 AM    Physical Exam:    Physical Exam: Blood pressure (!) 160/82, pulse (!) 36, height 6' (1.829 m), weight 197 lb 12.8 oz (89.7 kg), SpO2 98 %.  GEN:  Well nourished,  Elderly male,  well developed in no acute distress HEENT: Normal NECK: No JVD; No carotid bruits LYMPHATICS: No  lymphadenopathy CARDIAC: RR   RESPIRATORY:  Clear to auscultation without rales, wheezing or rhonchi  ABDOMEN: Soft, non-tender, non-distended MUSCULOSKELETAL:  No edema; No deformity  SKIN: Warm and dry NEUROLOGIC:  Alert and oriented x 3    ASSESSMENT:    1. Coronary artery disease involving native coronary artery of native heart without angina pectoris   2. Essential hypertension   3. PVC's (premature ventricular contractions)   4. Ischemic cardiomyopathy   5. Hypercholesterolemia    PLAN:    In order of problems listed above:  1.  Coronary artery disease s/pCABG in 1994, DES 2 to the LCx/OM bifurcation in 2010and DES 1 to the ostial LCx in 2014.He has 3 out of 4 grafts known to be occluded.  Cardiac catheterization in 09/2015 demonstrated patent stents in the left main, LCx and OM and patent RIMA-LAD. He has extensive left to right collaterals.  No angina    Has an inferior scar by myoview.   cathin 2017 shows an occluded RCA    2. Ischemic cardiomyopathy EF 40-45 by echo in 9/17.    No dyspnea,  No signs of volume excess   3. Essential hypertension BP has generally been good   4. Hyperlipidemia Labs checked by Dr. Brigitte Pulse.       Medication Adjustments/Labs and Tests Ordered: Current medicines are reviewed at length with the patient today.  Concerns regarding medicines are outlined above.  Tests Ordered: Orders Placed This Encounter  Procedures  . EKG 12-Lead   Medication Changes: Meds ordered this encounter  Medications  . nitroGLYCERIN (NITROSTAT) 0.4 MG SL tablet    Sig: Place 1 tablet (0.4 mg total) under the tongue every 5 (five) minutes as needed for chest pain.    Dispense:  25 tablet    Refill:  4    Signed, Mertie Moores, MD  08/30/2018 2:56 PM    [facility] [place], [place], [place]  [address] Phone: [phone]; Fax: [phone]

## 2018-08-30 ENCOUNTER — Other Ambulatory Visit: Payer: Self-pay

## 2018-08-30 ENCOUNTER — Ambulatory Visit: Payer: Medicare HMO | Admitting: Cardiovascular Disease

## 2018-08-30 ENCOUNTER — Encounter: Payer: Self-pay | Admitting: Cardiovascular Disease

## 2018-08-30 VITALS — BP 160/82 | HR 36 | Ht 72.0 in | Wt 197.8 lb

## 2018-08-30 DIAGNOSIS — I25709 Atherosclerosis of coronary artery bypass graft(s), unspecified, with unspecified angina pectoris: Secondary | ICD-10-CM

## 2018-08-30 DIAGNOSIS — E785 Hyperlipidemia, unspecified: Secondary | ICD-10-CM

## 2018-08-30 DIAGNOSIS — I493 Ventricular premature depolarization: Secondary | ICD-10-CM

## 2018-08-30 DIAGNOSIS — I10 Essential (primary) hypertension: Secondary | ICD-10-CM | POA: Diagnosis not present

## 2018-08-30 DIAGNOSIS — I255 Ischemic cardiomyopathy: Secondary | ICD-10-CM | POA: Diagnosis not present

## 2018-08-30 DIAGNOSIS — I2510 Atherosclerotic heart disease of native coronary artery without angina pectoris: Secondary | ICD-10-CM | POA: Diagnosis not present

## 2018-08-30 DIAGNOSIS — E7800 Pure hypercholesterolemia, unspecified: Secondary | ICD-10-CM | POA: Diagnosis not present

## 2018-08-30 MED ORDER — NITROGLYCERIN 0.4 MG SL SUBL: 0.4000 mg | SUBLINGUAL_TABLET | SUBLINGUAL | 4 refills | Status: AC | PRN

## 2018-08-30 NOTE — Patient Instructions (Signed)
Medication Instructions:  Your physician recommends that you continue on your current medications as directed. Please refer to the Current Medication list given to you today.  Labwork: None ordered.  Testing/Procedures: None ordered.  Follow-Up: Your physician recommends that you schedule a follow-up appointment in:   6 months with Dr [name]  Any Other Special Instructions Will Be Listed Below (If Applicable).     If you need a refill on your cardiac medications before your next appointment, please call your pharmacy.

## 2018-09-18 ENCOUNTER — Telehealth: Payer: Self-pay | Admitting: Cardiovascular Disease

## 2018-09-18 ENCOUNTER — Other Ambulatory Visit: Payer: Self-pay

## 2018-09-18 NOTE — Telephone Encounter (Signed)
Faxed ov notes and order to requested fax for billing .

## 2018-09-19 ENCOUNTER — Telehealth: Payer: Self-pay | Admitting: Cardiovascular Disease

## 2018-09-19 DIAGNOSIS — R69 Illness, unspecified: Secondary | ICD-10-CM | POA: Diagnosis not present

## 2018-09-19 NOTE — Telephone Encounter (Signed)
New Message    Patient is calling because he received a message from my chart about an appt for a monitor. I am not showing any type of order. Please call to discuss.

## 2018-09-20 ENCOUNTER — Other Ambulatory Visit: Payer: Self-pay | Admitting: *Deleted

## 2018-09-20 ENCOUNTER — Encounter: Payer: Self-pay | Admitting: Physician Assistant

## 2018-09-20 ENCOUNTER — Telehealth: Payer: Self-pay

## 2018-09-20 DIAGNOSIS — I493 Ventricular premature depolarization: Secondary | ICD-10-CM | POA: Insufficient documentation

## 2018-09-20 NOTE — Telephone Encounter (Signed)
LMTCB

## 2018-09-20 NOTE — Telephone Encounter (Signed)
Left message to call back  

## 2018-09-20 NOTE — Telephone Encounter (Signed)
Ok.  Continue Pindolol 2.5 mg daily at bedtime. [name], PA-C    09/20/2018 5:14 PM

## 2018-09-20 NOTE — Telephone Encounter (Signed)
-----   Message from Liliane Shi, [place] sent at 09/20/2018  1:33 PM EDT ----- Please call the patient His event monitor showed a high percentage of PVCs (20%). PLAN:   - Please refer to EP to discuss options for management of his PVCs.   - increase Pindolol to 5 mg in the AM and 2.5 mg in the PM [employer], PA-C    09/20/2018 1:22 PM

## 2018-09-20 NOTE — Telephone Encounter (Signed)
Copied message from Result Note: Loren Racer, RN  Adel Neyer T, PA-C        Spoke with pt and went over results. Pt mentioned that he only takes Pindolol 2.5mg  QHS. Med list shows 5mg  QD. Advised I will send message back to [name], PA-C to clarify what to do about Pindolol instructions? Pt agreeable to see EP. Will place referral.

## 2018-09-23 MED ORDER — PINDOLOL 5 MG PO TABS: 2.5000 mg | ORAL_TABLET | Freq: Every day | ORAL | Status: DC

## 2018-09-23 NOTE — Telephone Encounter (Signed)
Informed the patient of Scott's recommendation.  Pindolol at 2.5 mg nightly.

## 2018-09-23 NOTE — Telephone Encounter (Signed)
Follow up ° ° °Patient is returning your call. Please call. ° ° ° °

## 2018-10-14 ENCOUNTER — Encounter: Payer: Self-pay | Admitting: Internal Medicine

## 2018-10-14 ENCOUNTER — Telehealth: Payer: Self-pay

## 2018-10-14 ENCOUNTER — Ambulatory Visit (INDEPENDENT_AMBULATORY_CARE_PROVIDER_SITE_OTHER): Payer: Medicare HMO | Admitting: Internal Medicine

## 2018-10-14 ENCOUNTER — Other Ambulatory Visit: Payer: Self-pay

## 2018-10-14 VITALS — BP 142/70 | HR 68 | Ht 72.0 in | Wt 198.6 lb

## 2018-10-14 DIAGNOSIS — I493 Ventricular premature depolarization: Secondary | ICD-10-CM | POA: Diagnosis not present

## 2018-10-14 MED ORDER — MEXILETINE HCL 200 MG PO CAPS: 200.0000 mg | ORAL_CAPSULE | Freq: Two times a day (BID) | ORAL | 3 refills | Status: DC

## 2018-10-14 NOTE — Telephone Encounter (Signed)
**Note De-Identified Ellorie Kindall Obfuscation** I started a Mexiletine Non-formulary exception through covermymeds. Key: [id number]

## 2018-10-14 NOTE — Progress Notes (Signed)
HPI Mr. Girgenti is referred today by Dr. [name] for evaluation of PVC's. He has CAD, though he denies ever having an MI. He has multiple stents. He has collaterals. He has an inferior scar and an EF of 45%. He was noted to have 20%PVC's on a cardiac monitor worn for several weeks. He has dizzy spells but no frank syncope. He does not feel palpitations. No edema. No limit to activity. Allergies  Allergen Reactions  . Lipitor [Atorvastatin] Itching and Rash     Current Outpatient Medications  Medication Sig Dispense Refill  . aspirin EC 81 MG tablet Take 81 mg by mouth daily.    . clopidogrel (PLAVIX) 75 MG tablet Take 1 tablet (75 mg total) by mouth daily. 90 tablet 3  . isosorbide mononitrate (IMDUR) 30 MG 24 hr tablet Take 1 tablet by mouth once daily 90 tablet 1  . lisinopril (PRINIVIL,ZESTRIL) 5 MG tablet TAKE 1 TABLET BY MOUTH IN THE EVENING 90 tablet 2  . nitroGLYCERIN (NITROSTAT) 0.4 MG SL tablet Place 1 tablet (0.4 mg total) under the tongue every 5 (five) minutes as needed for chest pain. 25 tablet 4  . pindolol (VISKEN) 5 MG tablet Take 0.5 tablets (2.5 mg total) by mouth at bedtime.    . rosuvastatin (CRESTOR) 20 MG tablet Take 20 mg by mouth daily.    Marland Kitchen mexiletine (MEXITIL) 200 MG capsule Take 1 capsule (200 mg total) by mouth 2 (two) times daily. 180 capsule 3   No current facility-administered medications for this visit.      Past Medical History:  Diagnosis Date  . Bradycardia    a. event monitor (8/14):  NSR, sinus brady, very frequent PACs and occasional atrial runs (3-4 beats).  No significant pauses.  . Clavicle fracture    left  . Coronary artery disease    a. s/p CABG 1994 // b. Abnl MV 08/2008: Ant-lat ischemia, EF 57%=>LHC 7/10: Xience DES x2 to bifurc lesion of LCx/OM // c. Abnl MV 08/2012-> cath with interval occ of SVG-RCA, CTO of LAD/RCA, severe prox LCx stenosis s/p FFR-guided DES to LCx // e. LHC 8/17: LM stent ok, oLAD 100, dLAD 45, RIMA-LAD ok, oLCx  stent ok, OM1 stent ok, oRCA 100, S-RCA 100, L-R collats, EF 45-50 >> med Rx   . GERD (gastroesophageal reflux disease)   . Hyperlipidemia   . Hypertension    mild hypertension  . Ischemic cardiomyopathy    a. Echo 9/17: EF 40-45%, anteroseptal HK, grade 2 diastolic dysfunction, mild AI, mild LAE // Echo 08/2018: EF 50-55, normal diast function, no RWMA, RVSP 27.3, mild LAE, trivial AI, mild MR  . Myoview    Myoview 08/2018: EF 49, inf/inf-lat/apical inf scar; no ischemia; Low Risk  . PVC's (premature ventricular contractions)    Event monitor 09/2018: normal sinus rhythm, freq PVCs (burden 20%)  . Thrombocytopenia ([place])    a. Noted on labs 08/2012.    ROS:   All systems reviewed and negative except as noted in the HPI.   Past Surgical History:  Procedure Laterality Date  . CARDIAC CATHETERIZATION     His most recent in August of 2010 showed totally occluded LAD,  . CARDIAC CATHETERIZATION N/A 10/14/2015   Procedure: Left Heart Cath and Coronary Angiography;  Surgeon: Leonie Man, MD;  Location: [facility];  Service: Cardiovascular;  Laterality: N/A;  . COLONOSCOPY    . CORONARY ANGIOPLASTY    . CORONARY ARTERY BYPASS GRAFT  1993   RIMA-LAD, SVG-1ST, 2ND DIAG, SVG-1ST, 2 ND OM, PD  . CORONARY STENT PLACEMENT  7/10   Stent- Left Cirx  . FRACTIONAL FLOW RESERVE WIRE N/A 08/29/2012   Procedure: FRACTIONAL FLOW RESERVE WIRE;  Surgeon: Sherren Mocha, MD;  Location: [facility] CATH LAB;  Service: Cardiovascular;  Laterality: N/A;  . FRACTURE SURGERY     right  . ORIF CLAVICULAR FRACTURE Left 05/21/2014   Procedure: OPEN REDUCTION INTERNAL FIXATION (ORIF) CLAVICULAR FRACTURE;  Surgeon: Tania Ade, MD;  Location: [facility];  Service: Orthopedics;  Laterality: Left;  Open reduction internal fixation clavical fracture  . US ECHOCARDIOGRAPHY  10-29-2007   EF 55-60%     Family History  Problem Relation Age of Onset  . Heart disease Brother   . Stroke Other   . Hypertension Other       Social History   Socioeconomic History  . Marital status: Married    Spouse name: Not on file  . Number of children: Not on file  . Years of education: Not on file  . Highest education level: Not on file  Occupational History  . Not on file  Social Needs  . Financial resource strain: Not on file  . Food insecurity    Worry: Not on file    Inability: Not on file  . Transportation needs    Medical: Not on file    Non-medical: Not on file  Tobacco Use  . Smoking status: Former Smoker    Quit date: 02/14/1963    Years since quitting: 55.7  . Smokeless tobacco: Never Used  Substance and Sexual Activity  . Alcohol use: No  . Drug use: No  . Sexual activity: Not on file  Lifestyle  . Physical activity    Days per week: Not on file    Minutes per session: Not on file  . Stress: Not on file  Relationships  . Social Herbalist on phone: Not on file    Gets together: Not on file    Attends religious service: Not on file    Active member of club or organization: Not on file    Attends meetings of clubs or organizations: Not on file    Relationship status: Not on file  . Intimate partner violence    Fear of current or ex partner: Not on file    Emotionally abused: Not on file    Physically abused: Not on file    Forced sexual activity: Not on file  Other Topics Concern  . Not on file  Social History Narrative  . Not on file     BP (!) 142/70   Pulse 68   Ht 6' (1.829 m)   Wt 198 lb 9.6 oz (90.1 kg)   SpO2 98%   BMI 26.94 kg/m   Physical Exam:  Well appearing NAD HEENT: Unremarkable Neck:  No JVD, no thyromegally Lymphatics:  No adenopathy Back:  No CVA tenderness Lungs:  Clear with no wheezes HEART:  IRegular rate rhythm, no murmurs, no rubs, no clicks Abd:  soft, positive bowel sounds, no organomegally, no rebound, no guarding Ext:  2 plus pulses, no edema, no cyanosis, no clubbing Skin:  No rashes no nodules Neuro:  CN II through XII intact,  motor grossly intact  EKG - nsr with PVC's  Assess/Plan: 1. Symptomatic PVC's - he does not have palpitations but does have dizzy spells. I discussed the treatment options for the patient and recommended he start mexitil.  If he improves and has more energy will assume that the PVC's are better. If no help then we will consider catheter ablation.  2. CAD - he denies anginal symptoms. Stress test noted. 3. HTN -= her blood pressure today is elevated. He is encouraged to maintain a low sodium diet.  Mikle Bosworth.D.

## 2018-10-14 NOTE — Patient Instructions (Addendum)
Medication Instructions:  Your physician has recommended you make the following change in your medication:   1.  Start taking mexiletine 200 mg--one tablet by mouth twice a day  Labwork: None ordered.  Testing/Procedures: Come to [facility] in 2 weeks for nurse visit for EKG after new start mexiletine.  Follow-Up: Your physician wants you to follow-up in: 8 weeks with Dr. Lovena Le.     Any Other Special Instructions Will Be Listed Below (If Applicable).  If you need a refill on your cardiac medications before your next appointment, please call your pharmacy.   Mexiletine capsules What is this medicine? MEXILETINE (mex IL e teen) is an antiarrhythmic agent. This medicine is used to treat irregular heart rhythm and can slow rapid heartbeats. It can help your heart to return to and maintain a normal rhythm. Because of the side effects caused by this medicine, it is usually used for heartbeat problems that may be life-threatening. This medicine may be used for other purposes; ask your health care provider or pharmacist if you have questions. COMMON BRAND NAME(S): Mexitil What should I tell my health care provider before I take this medicine? They need to know if you have any of these conditions:  liver disease  other heart problems  previous heart attack  an unusual or allergic reaction to mexiletine, other medicines, foods, dyes, or preservatives  pregnant or trying to get pregnant  breast-feeding How should I use this medicine? Take this medicine by mouth with a glass of water. Follow the directions on the prescription label. It is recommended that you take this medicine with food or an antacid. Take your doses at regular intervals. Do not take your medicine more often than directed. Do not stop taking except on the advice of your doctor or health care professional. Talk to your pediatrician regarding the use of this medicine in children. Special care may be  needed. Overdosage: If you think you have taken too much of this medicine contact a poison control center or emergency room at once. NOTE: This medicine is only for you. Do not share this medicine with others. What if I miss a dose? If you miss a dose, take it as soon as you can. If it is almost time for your next dose, take only that dose. Do not take double or extra doses. What may interact with this medicine? Do not take this medicine with any of the following medications:  dofetilide This medicine may also interact with the following medications:  caffeine  cimetidine  medicines for depression, anxiety, or psychotic disturbances  medicines to control heart rhythm  phenobarbital  phenytoin  rifampin  theophylline This list may not describe all possible interactions. Give your health care provider a list of all the medicines, herbs, non-prescription drugs, or dietary supplements you use. Also tell them if you smoke, drink alcohol, or use illegal drugs. Some items may interact with your medicine. What should I watch for while using this medicine? Your condition will be monitored closely when you first begin therapy. Often, this drug is first started in a hospital or other monitored health care setting. Once you are on maintenance therapy, visit your doctor or health care provider for regular checks on your progress. Because your condition and use of this medicine carry some risk, it is a good idea to carry an identification card, necklace or bracelet with details of your condition, medications, and doctor or health care provider. You may get drowsy or dizzy. Do not  drive, use machinery, or do anything that needs mental alertness until you know how this medicine affects you. Do not stand or sit up quickly, especially if you are an older patient. This reduces the risk of dizzy or fainting spells. Alcohol can make you more dizzy, increase flushing and rapid heartbeats. Avoid alcoholic  drinks. This medicine may cause serious skin reactions. They can happen weeks to months after starting the medicine. Contact your health care provider right away if you notice fevers or flu-like symptoms with a rash. The rash may be red or purple and then turn into blisters or peeling of the skin. Or, you might notice a red rash with swelling of the face, lips or lymph nodes in your neck or under your arms. What side effects may I notice from receiving this medicine? Side effects that you should report to your doctor or health care professional as soon as possible:  allergic reactions like skin rash, itching or hives, swelling of the face, lips, or tongue  breathing problems  chest pain, continued irregular heartbeats  rash, fever, and swollen lymph nodes  redness, blistering, peeling or loosening of the skin, including inside the mouth  seizures  skin rash  trembling, shaking  unusual bleeding or bruising  unusually weak or tired Side effects that usually do not require medical attention (report to your doctor or health care professional if they continue or are bothersome):  blurred vision  difficulty walking  heartburn  nausea, vomiting  nervousness  numbness, or tingling in the fingers or toes This list may not describe all possible side effects. Call your doctor for medical advice about side effects. You may report side effects to FDA at 1-800-FDA-1088. Where should I keep my medicine? Keep out of reach of children. Store at room temperature between 15 and 30 degrees C (59 and 86 degrees F). Throw away any unused medicine after the expiration date. NOTE: This sheet is a summary. It may not cover all possible information. If you have questions about this medicine, talk to your doctor, pharmacist, or health care provider.  2020 Elsevier/Gold Standard (2018-05-08 09:25:42)

## 2018-10-15 DIAGNOSIS — R69 Illness, unspecified: Secondary | ICD-10-CM | POA: Diagnosis not present

## 2018-10-15 NOTE — Telephone Encounter (Signed)
Letter received from [facility] stating that they have approved the pts Mexiletine non-formulary exception and that the approval is good until 02/13/2019.  I have attempted several times to notify [facility] the pts pharmacy of this approval but am unable to s/w a person or leave a message.  I have called 3 Xs this morning but as soon as they answer that say "please hold" and put me on hold for more that 5 mins each time and there is no way to leave a message.

## 2018-10-17 ENCOUNTER — Telehealth: Payer: Self-pay | Admitting: *Deleted

## 2018-10-17 MED ORDER — MEXILETINE HCL 200 MG PO CAPS: 200.0000 mg | ORAL_CAPSULE | Freq: Two times a day (BID) | ORAL | 6 refills | Status: DC

## 2018-10-17 NOTE — Telephone Encounter (Signed)
I spoke with [facility] and with tier exception outlined in previous phone note cost of mexiletine is $342 for 90 day supply. I sent 30 day prescription with refills to [facility] on [facility] and notified pt

## 2018-10-17 NOTE — Telephone Encounter (Signed)
Dr. Lovena Le please have your nurse call me ASAP Thursday morning concerning a prescription for Mexiletine. Thanks (copied from my chart message)  I spoke with pt who reports he has spoken with his insurance and medication is not covered. Pt states he can get prescription with good Rx coupon for $47/month at [facility] on [address]. He is asking if prescription can be sent to [facility]. I told pt our office has been working on getting coverage for medication but has not been able to speak with [employer].  I told pt I would send message to nurse working on coverage and we would call him back once determined where it would be most affordable for him to fill medication.

## 2018-10-24 ENCOUNTER — Telehealth: Payer: Self-pay | Admitting: Internal Medicine

## 2018-10-24 NOTE — Telephone Encounter (Signed)
New message:     Patient calling stating that some one called him. I did not see a note. Please call patient 

## 2018-10-28 ENCOUNTER — Other Ambulatory Visit: Payer: Self-pay | Admitting: Physician Assistant

## 2018-10-29 ENCOUNTER — Other Ambulatory Visit: Payer: Self-pay

## 2018-10-29 ENCOUNTER — Ambulatory Visit (INDEPENDENT_AMBULATORY_CARE_PROVIDER_SITE_OTHER): Payer: Medicare HMO

## 2018-10-29 VITALS — HR 61 | Ht 72.0 in | Wt 196.0 lb

## 2018-10-29 DIAGNOSIS — I493 Ventricular premature depolarization: Secondary | ICD-10-CM | POA: Diagnosis not present

## 2018-10-29 NOTE — Patient Instructions (Signed)
1.) Reason for visit: Repeat EKG 2 weeks s/p mexiletine start  Name of MD requesting visit: Dr. Lovena Le  2.) H&P: Pt was started on mexiletine for PVC burden 20%-started mexiletine on October 14, 2018  3.) ROS related to problem: Pt has been on mexiletine for 2 weeks.  Pt states he has had nausea with the medication.  Also feels he has had trouble sleeping.  He takes the medication at 9:00 am and 9:00 pm.  Discussed changing timing of medication to help with side effects, but he states he does not want to change timing because then he would have to take medication with him.  Per Pt he continues to have fatigue on the mexiletine.  He has not noticed any improvement since starting the medication.  4.) Assessment and plan per MD:  Discussed with Dr. Lovena Le.  Per Dr. Lovena Le if Pt does not feel any better (less fatigue) on mexiletine the next step is to get a 24 hour monitor to assess Pt's current PVC burden on mexiletine.  Schedule follow up with Dr. Lovena Le in 3 weeks.  Discussed with Pt.  Pt agreeable to 24 hour monitor.   Per Pt he is also having trouble finding this dose of mexiletine.  Pt will call around to determine if he can find medication and this nurse will call tomorrow.  Order entered for monitor.

## 2018-10-30 ENCOUNTER — Telehealth: Payer: Self-pay | Admitting: *Deleted

## 2018-10-30 NOTE — Telephone Encounter (Signed)
3 day ZIO XT long term holter monitor to be mailed to the patients home.  Patient is aware he only needs to wear the monitor 24 hours.  Instructions reviewed briefly as they are included in the monitor kit.

## 2018-11-05 ENCOUNTER — Ambulatory Visit (INDEPENDENT_AMBULATORY_CARE_PROVIDER_SITE_OTHER): Payer: Medicare HMO

## 2018-11-05 DIAGNOSIS — I493 Ventricular premature depolarization: Secondary | ICD-10-CM | POA: Diagnosis not present

## 2018-11-14 MED ORDER — PINDOLOL 5 MG PO TABS: 2.5000 mg | ORAL_TABLET | Freq: Every day | ORAL | 3 refills | Status: DC

## 2018-11-14 NOTE — Telephone Encounter (Signed)
Pt's medication was sent to pt's pharmacy as requested. Confirmation received.  °

## 2018-11-19 DIAGNOSIS — I493 Ventricular premature depolarization: Secondary | ICD-10-CM | POA: Diagnosis not present

## 2018-11-21 ENCOUNTER — Other Ambulatory Visit: Payer: Self-pay

## 2018-11-21 ENCOUNTER — Ambulatory Visit: Payer: Medicare HMO | Admitting: Internal Medicine

## 2018-11-21 ENCOUNTER — Encounter: Payer: Self-pay | Admitting: Internal Medicine

## 2018-11-21 VITALS — BP 162/90 | HR 63 | Ht 72.0 in | Wt 190.0 lb

## 2018-11-21 DIAGNOSIS — I493 Ventricular premature depolarization: Secondary | ICD-10-CM

## 2018-11-21 NOTE — Patient Instructions (Signed)
Medication Instructions:  Your physician has recommended you make the following change in your medication:   Stop mexilitine  Labwork: None ordered.  Testing/Procedures: None ordered.  Follow-Up: Your physician wants you to follow-up in: 4 months with Dr. Lovena Le.   You will receive a reminder letter in the mail two months in advance. If you don't receive a letter, please call our office to schedule the follow-up appointment.   Any Other Special Instructions Will Be Listed Below (If Applicable).  If you need a refill on your cardiac medications before your next appointment, please call your pharmacy.

## 2018-11-21 NOTE — Progress Notes (Signed)
HPI [name] is referred today for evaluation of PVC's. He has CAD, though he denies ever having an MI. He has multiple stents. He has collaterals. He has an inferior scar and an EF of 45%. He was noted to have 20%PVC's on a cardiac monitor worn for several months ago. He has dizzy spells but no frank syncope. He does not feel palpitations. No edema. No limit to activity. When I saw him last we started mexitil. He cannot feel any better. He does note constipation and may go 3 days between bowel movements.  Allergies  Allergen Reactions  . Lipitor [Atorvastatin] Itching and Rash     Current Outpatient Medications  Medication Sig Dispense Refill  . aspirin EC 81 MG tablet Take 81 mg by mouth daily.    . clopidogrel (PLAVIX) 75 MG tablet Take 1 tablet (75 mg total) by mouth daily. 90 tablet 3  . isosorbide mononitrate (IMDUR) 30 MG 24 hr tablet Take 1 tablet by mouth once daily 90 tablet 1  . lisinopril (ZESTRIL) 5 MG tablet Take 1 tablet by mouth in the evening 90 tablet 1  . mexiletine (MEXITIL) 200 MG capsule Take 1 capsule (200 mg total) by mouth 2 (two) times daily. 60 capsule 6  . nitroGLYCERIN (NITROSTAT) 0.4 MG SL tablet Place 1 tablet (0.4 mg total) under the tongue every 5 (five) minutes as needed for chest pain. 25 tablet 4  . pindolol (VISKEN) 5 MG tablet Take 0.5 tablets (2.5 mg total) by mouth at bedtime. 45 tablet 3  . rosuvastatin (CRESTOR) 20 MG tablet Take 20 mg by mouth daily.     No current facility-administered medications for this visit.      Past Medical History:  Diagnosis Date  . Bradycardia    a. event monitor (8/14):  NSR, sinus brady, very frequent PACs and occasional atrial runs (3-4 beats).  No significant pauses.  . Clavicle fracture    left  . Coronary artery disease    a. s/p CABG 1994 // b. Abnl MV 08/2008: Ant-lat ischemia, EF 57%=>LHC 7/10: Xience DES x2 to bifurc lesion of LCx/OM // c. Abnl MV 08/2012-> cath with interval occ of SVG-RCA, CTO of  LAD/RCA, severe prox LCx stenosis s/p FFR-guided DES to LCx // e. LHC 8/17: LM stent ok, oLAD 100, dLAD 45, RIMA-LAD ok, oLCx stent ok, OM1 stent ok, oRCA 100, S-RCA 100, L-R collats, EF 45-50 >> med Rx   . GERD (gastroesophageal reflux disease)   . Hyperlipidemia   . Hypertension    mild hypertension  . Ischemic cardiomyopathy    a. Echo 9/17: EF 40-45%, anteroseptal HK, grade 2 diastolic dysfunction, mild AI, mild LAE // Echo 08/2018: EF 50-55, normal diast function, no RWMA, RVSP 27.3, mild LAE, trivial AI, mild MR  . Myoview    Myoview 08/2018: EF 49, inf/inf-lat/apical inf scar; no ischemia; Low Risk  . PVC's (premature ventricular contractions)    Event monitor 09/2018: normal sinus rhythm, freq PVCs (burden 20%)  . Thrombocytopenia ([place])    a. Noted on labs 08/2012.    ROS:   All systems reviewed and negative except as noted in the HPI.   Past Surgical History:  Procedure Laterality Date  . CARDIAC CATHETERIZATION     His most recent in August of 2010 showed totally occluded LAD,  . CARDIAC CATHETERIZATION N/A 10/14/2015   Procedure: Left Heart Cath and Coronary Angiography;  Surgeon: Leonie Man, MD;  Location: [facility];  Service: Cardiovascular;  Laterality: N/A;  . COLONOSCOPY    . CORONARY ANGIOPLASTY    . CORONARY ARTERY BYPASS GRAFT  1993   RIMA-LAD, SVG-1ST, 2ND DIAG, SVG-1ST, 2 ND OM, PD  . CORONARY STENT PLACEMENT  7/10   Stent- Left Cirx  . FRACTIONAL FLOW RESERVE WIRE N/A 08/29/2012   Procedure: FRACTIONAL FLOW RESERVE WIRE;  Surgeon: Sherren Mocha, MD;  Location: [facility] CATH LAB;  Service: Cardiovascular;  Laterality: N/A;  . FRACTURE SURGERY     right  . ORIF CLAVICULAR FRACTURE Left 05/21/2014   Procedure: OPEN REDUCTION INTERNAL FIXATION (ORIF) CLAVICULAR FRACTURE;  Surgeon: Tania Ade, MD;  Location: [place] [facility];  Service: Orthopedics;  Laterality: Left;  Open reduction internal fixation clavical fracture  . US ECHOCARDIOGRAPHY  10-29-2007   EF 55-60%      Family History  Problem Relation Age of Onset  . Heart disease Brother   . Stroke Other   . Hypertension Other      Social History   Socioeconomic History  . Marital status: Married    Spouse name: Not on file  . Number of children: Not on file  . Years of education: Not on file  . Highest education level: Not on file  Occupational History  . Not on file  Social Needs  . Financial resource strain: Not on file  . Food insecurity    Worry: Not on file    Inability: Not on file  . Transportation needs    Medical: Not on file    Non-medical: Not on file  Tobacco Use  . Smoking status: Former Smoker    Quit date: 02/14/1963    Years since quitting: 55.8  . Smokeless tobacco: Never Used  Substance and Sexual Activity  . Alcohol use: No  . Drug use: No  . Sexual activity: Not on file  Lifestyle  . Physical activity    Days per week: Not on file    Minutes per session: Not on file  . Stress: Not on file  Relationships  . Social Herbalist on phone: Not on file    Gets together: Not on file    Attends religious service: Not on file    Active member of club or organization: Not on file    Attends meetings of clubs or organizations: Not on file    Relationship status: Not on file  . Intimate partner violence    Fear of current or ex partner: Not on file    Emotionally abused: Not on file    Physically abused: Not on file    Forced sexual activity: Not on file  Other Topics Concern  . Not on file  Social History Narrative  . Not on file     BP (!) 162/90   Pulse 63   Ht 6' (1.829 m)   Wt 190 lb (86.2 kg)   SpO2 98%   BMI 25.77 kg/m   Physical Exam:  Well appearing NAD HEENT: Unremarkable Neck:  No JVD, no thyromegally Lymphatics:  No adenopathy Back:  No CVA tenderness Lungs:  Clear with no wheezes HEART:  Regular rate rhythm, no murmurs, no rubs, no clicks Abd:  soft, positive bowel sounds, no organomegally, no rebound, no guarding Ext:   2 plus pulses, no edema, no cyanosis, no clubbing Skin:  No rashes no nodules Neuro:  CN II through XII intact, motor grossly intact  EKG - NSR with PVC's  Assess/Plan: 1. PVC"s - I  discussed the treatment options in detail. The patient has had no improvement with mexilitine. I have recommended he stop this medication. Because he has not felt better or worse, he will undergo watchful waiting.  2. CAD - He denies anginal symptoms. He will continue his current meds.  Mikle Bosworth.D.

## 2018-12-12 DIAGNOSIS — E038 Other specified hypothyroidism: Secondary | ICD-10-CM | POA: Diagnosis not present

## 2018-12-12 DIAGNOSIS — Z125 Encounter for screening for malignant neoplasm of prostate: Secondary | ICD-10-CM | POA: Diagnosis not present

## 2018-12-12 DIAGNOSIS — E7849 Other hyperlipidemia: Secondary | ICD-10-CM | POA: Diagnosis not present

## 2018-12-19 DIAGNOSIS — E039 Hypothyroidism, unspecified: Secondary | ICD-10-CM | POA: Diagnosis not present

## 2018-12-19 DIAGNOSIS — Z9861 Coronary angioplasty status: Secondary | ICD-10-CM | POA: Diagnosis not present

## 2018-12-19 DIAGNOSIS — Z1331 Encounter for screening for depression: Secondary | ICD-10-CM | POA: Diagnosis not present

## 2018-12-19 DIAGNOSIS — I10 Essential (primary) hypertension: Secondary | ICD-10-CM | POA: Diagnosis not present

## 2018-12-19 DIAGNOSIS — Z0000 Encounter for general adult medical examination without abnormal findings: Secondary | ICD-10-CM | POA: Diagnosis not present

## 2018-12-19 DIAGNOSIS — I493 Ventricular premature depolarization: Secondary | ICD-10-CM | POA: Diagnosis not present

## 2018-12-19 DIAGNOSIS — E785 Hyperlipidemia, unspecified: Secondary | ICD-10-CM | POA: Diagnosis not present

## 2018-12-19 DIAGNOSIS — K219 Gastro-esophageal reflux disease without esophagitis: Secondary | ICD-10-CM | POA: Diagnosis not present

## 2018-12-19 DIAGNOSIS — R82998 Other abnormal findings in urine: Secondary | ICD-10-CM | POA: Diagnosis not present

## 2018-12-19 DIAGNOSIS — I255 Ischemic cardiomyopathy: Secondary | ICD-10-CM | POA: Diagnosis not present

## 2018-12-19 DIAGNOSIS — D692 Other nonthrombocytopenic purpura: Secondary | ICD-10-CM | POA: Diagnosis not present

## 2018-12-19 DIAGNOSIS — D696 Thrombocytopenia, unspecified: Secondary | ICD-10-CM | POA: Diagnosis not present

## 2018-12-23 DIAGNOSIS — Z1212 Encounter for screening for malignant neoplasm of rectum: Secondary | ICD-10-CM | POA: Diagnosis not present

## 2019-01-01 ENCOUNTER — Ambulatory Visit: Payer: Medicare HMO | Admitting: Internal Medicine

## 2019-02-10 MED ORDER — PINDOLOL 5 MG PO TABS: 2.5000 mg | ORAL_TABLET | Freq: Every day | ORAL | 3 refills | Status: DC

## 2019-03-03 ENCOUNTER — Other Ambulatory Visit: Payer: Self-pay | Admitting: Physician Assistant

## 2019-03-08 ENCOUNTER — Ambulatory Visit: Payer: Medicare HMO

## 2019-03-18 DIAGNOSIS — E038 Other specified hypothyroidism: Secondary | ICD-10-CM | POA: Diagnosis not present

## 2019-03-27 ENCOUNTER — Encounter: Payer: Self-pay | Admitting: Internal Medicine

## 2019-03-27 ENCOUNTER — Other Ambulatory Visit: Payer: Self-pay

## 2019-03-27 ENCOUNTER — Ambulatory Visit: Payer: Medicare HMO | Admitting: Internal Medicine

## 2019-03-27 VITALS — BP 138/84 | HR 84 | Ht 72.0 in | Wt 207.6 lb

## 2019-03-27 DIAGNOSIS — I493 Ventricular premature depolarization: Secondary | ICD-10-CM | POA: Diagnosis not present

## 2019-03-27 DIAGNOSIS — I255 Ischemic cardiomyopathy: Secondary | ICD-10-CM

## 2019-03-27 NOTE — Progress Notes (Signed)
HPI [name] returns today for followup of his PVC's. He is a 79 yo man with a h/o HTN and CAD, who was referred for PVC's and mild LV dysfunction. He did not experience palpitations. He notes that his energy level has been down a bit but not sure if due to PVC's. He was initially prescribed mexitil with no improvement. He was stopped. He feels about the same. No syncope. No chest pain or sob or edema. Allergies  Allergen Reactions  . Lipitor [Atorvastatin] Itching and Rash     Current Outpatient Medications  Medication Sig Dispense Refill  . aspirin EC 81 MG tablet Take 81 mg by mouth daily.    . clopidogrel (PLAVIX) 75 MG tablet Take 1 tablet by mouth once daily 90 tablet 1  . EUTHYROX 50 MCG tablet Take 50 mcg by mouth daily.    . isosorbide mononitrate (IMDUR) 30 MG 24 hr tablet Take 1 tablet by mouth once daily 90 tablet 1  . lisinopril (ZESTRIL) 5 MG tablet Take 1 tablet by mouth in the evening 90 tablet 1  . nitroGLYCERIN (NITROSTAT) 0.4 MG SL tablet Place 1 tablet (0.4 mg total) under the tongue every 5 (five) minutes as needed for chest pain. 25 tablet 4  . pindolol (VISKEN) 5 MG tablet Take 0.5 tablets (2.5 mg total) by mouth at bedtime. 45 tablet 3  . rosuvastatin (CRESTOR) 20 MG tablet Take 20 mg by mouth daily.     No current facility-administered medications for this visit.     Past Medical History:  Diagnosis Date  . Bradycardia    a. event monitor (8/14):  NSR, sinus brady, very frequent PACs and occasional atrial runs (3-4 beats).  No significant pauses.  . Clavicle fracture    left  . Coronary artery disease    a. s/p CABG 1994 // b. Abnl MV 08/2008: Ant-lat ischemia, EF 57%=>LHC 7/10: Xience DES x2 to bifurc lesion of LCx/OM // c. Abnl MV 08/2012-> cath with interval occ of SVG-RCA, CTO of LAD/RCA, severe prox LCx stenosis s/p FFR-guided DES to LCx // e. LHC 8/17: LM stent ok, oLAD 100, dLAD 45, RIMA-LAD ok, oLCx stent ok, OM1 stent ok, oRCA 100, S-RCA 100,  L-R collats, EF 45-50 >> med Rx   . GERD (gastroesophageal reflux disease)   . Hyperlipidemia   . Hypertension    mild hypertension  . Ischemic cardiomyopathy    a. Echo 9/17: EF 40-45%, anteroseptal HK, grade 2 diastolic dysfunction, mild AI, mild LAE // Echo 08/2018: EF 50-55, normal diast function, no RWMA, RVSP 27.3, mild LAE, trivial AI, mild MR  . Myoview    Myoview 08/2018: EF 49, inf/inf-lat/apical inf scar; no ischemia; Low Risk  . PVC's (premature ventricular contractions)    Event monitor 09/2018: normal sinus rhythm, freq PVCs (burden 20%)  . Thrombocytopenia ([place])    a. Noted on labs 08/2012.    ROS:   All systems reviewed and negative except as noted in the HPI.   Past Surgical History:  Procedure Laterality Date  . CARDIAC CATHETERIZATION     His most recent in August of 2010 showed totally occluded LAD,  . CARDIAC CATHETERIZATION N/A 10/14/2015   Procedure: Left Heart Cath and Coronary Angiography;  Surgeon: Leonie Man, MD;  Location: [facility];  Service: Cardiovascular;  Laterality: N/A;  . COLONOSCOPY    . CORONARY ANGIOPLASTY    . CORONARY ARTERY BYPASS GRAFT  1993   RIMA-LAD,  SVG-1ST, 2ND DIAG, SVG-1ST, 2 ND OM, PD  . CORONARY STENT PLACEMENT  7/10   Stent- Left Cirx  . FRACTIONAL FLOW RESERVE WIRE N/A 08/29/2012   Procedure: FRACTIONAL FLOW RESERVE WIRE;  Surgeon: Sherren Mocha, MD;  Location: [facility] CATH LAB;  Service: Cardiovascular;  Laterality: N/A;  . FRACTURE SURGERY     right  . ORIF CLAVICULAR FRACTURE Left 05/21/2014   Procedure: OPEN REDUCTION INTERNAL FIXATION (ORIF) CLAVICULAR FRACTURE;  Surgeon: Tania Ade, MD;  Location: [facility];  Service: Orthopedics;  Laterality: Left;  Open reduction internal fixation clavical fracture  . US ECHOCARDIOGRAPHY  10-29-2007   EF 55-60%     Family History  Problem Relation Age of Onset  . Heart disease Brother   . Stroke Other   . Hypertension Other      Social History   Socioeconomic History    . Marital status: Married    Spouse name: Not on file  . Number of children: Not on file  . Years of education: Not on file  . Highest education level: Not on file  Occupational History  . Not on file  Tobacco Use  . Smoking status: Former Smoker    Quit date: 02/14/1963    Years since quitting: 56.1  . Smokeless tobacco: Never Used  Substance and Sexual Activity  . Alcohol use: No  . Drug use: No  . Sexual activity: Not on file  Other Topics Concern  . Not on file  Social History Narrative  . Not on file   Social Determinants of Health   Financial Resource Strain:   . Difficulty of Paying Living Expenses: Not on file  Food Insecurity:   . Worried About Charity fundraiser in the Last Year: Not on file  . Ran Out of Food in the Last Year: Not on file  Transportation Needs:   . Lack of Transportation (Medical): Not on file  . Lack of Transportation (Non-Medical): Not on file  Physical Activity:   . Days of Exercise per Week: Not on file  . Minutes of Exercise per Session: Not on file  Stress:   . Feeling of Stress : Not on file  Social Connections:   . Frequency of Communication with Friends and Family: Not on file  . Frequency of Social Gatherings with Friends and Family: Not on file  . Attends Religious Services: Not on file  . Active Member of Clubs or Organizations: Not on file  . Attends Archivist Meetings: Not on file  . Marital Status: Not on file  Intimate Partner Violence:   . Fear of Current or Ex-Partner: Not on file  . Emotionally Abused: Not on file  . Physically Abused: Not on file  . Sexually Abused: Not on file     BP 138/84   Pulse 84   Ht 6' (1.829 m)   Wt 207 lb 9.6 oz (94.2 kg)   SpO2 97%   BMI 28.16 kg/m   Physical Exam:  Well appearing NAD HEENT: Unremarkable Neck:  No JVD, no thyromegally Lymphatics:  No adenopathy Back:  No CVA tenderness Lungs:  Clear with no wheezes HEART:  IRegular rate rhythm, no murmurs, no  rubs, no clicks Abd:  soft, positive bowel sounds, no organomegally, no rebound, no guarding Ext:  2 plus pulses, no edema, no cyanosis, no clubbing Skin:  No rashes no nodules Neuro:  CN II through XII intact, motor grossly intact  EKG - nsr with PVC's  Assess/Plan: 1.  PVC's - I discussed amio, sotalol and cahteter ablation vs watchful waiting vs catheter ablation. He would like to think about this. I will see him back in 4 months. If his symptoms worsen, he will call and we will either admit for sotalol or initiate amiodarone. 2. CAD - he denies anginal symptoms. He is active.  3. HTN - his bp is minimally elevated. We will follow.  Mikle Bosworth.D.

## 2019-03-27 NOTE — Patient Instructions (Addendum)
Medication Instructions:  Your physician recommends that you continue on your current medications as directed. Please refer to the Current Medication list given to you today.  Labwork: None ordered.  Testing/Procedures: None ordered.  Follow-Up: Your physician wants you to follow-up in:   July 16, 2019 at 9:30 am with Dr. Lovena Le  Any Other Special Instructions Will Be Listed Below (If Applicable).  If you need a refill on your cardiac medications before your next appointment, please call your pharmacy.

## 2019-03-31 DIAGNOSIS — R69 Illness, unspecified: Secondary | ICD-10-CM | POA: Diagnosis not present

## 2019-04-28 ENCOUNTER — Other Ambulatory Visit: Payer: Self-pay | Admitting: Physician Assistant

## 2019-06-12 ENCOUNTER — Other Ambulatory Visit: Payer: Self-pay | Admitting: Physician Assistant

## 2019-06-12 DIAGNOSIS — I2510 Atherosclerotic heart disease of native coronary artery without angina pectoris: Secondary | ICD-10-CM

## 2019-06-12 MED ORDER — ISOSORBIDE MONONITRATE ER 30 MG PO TB24: 30.0000 mg | ORAL_TABLET | Freq: Every day | ORAL | 0 refills | Status: DC

## 2019-07-16 ENCOUNTER — Ambulatory Visit: Payer: Medicare HMO | Admitting: Internal Medicine

## 2019-07-16 ENCOUNTER — Encounter: Payer: Self-pay | Admitting: Internal Medicine

## 2019-07-16 ENCOUNTER — Other Ambulatory Visit: Payer: Self-pay

## 2019-07-16 VITALS — BP 152/80 | HR 64 | Ht 72.0 in | Wt 202.0 lb

## 2019-07-16 DIAGNOSIS — I10 Essential (primary) hypertension: Secondary | ICD-10-CM | POA: Diagnosis not present

## 2019-07-16 DIAGNOSIS — I25119 Atherosclerotic heart disease of native coronary artery with unspecified angina pectoris: Secondary | ICD-10-CM

## 2019-07-16 DIAGNOSIS — I493 Ventricular premature depolarization: Secondary | ICD-10-CM | POA: Diagnosis not present

## 2019-07-16 NOTE — Patient Instructions (Addendum)

## 2019-07-16 NOTE — Progress Notes (Signed)
HPI [name] returns today for followup of his PVC's. He has CAD and HTN, mostly white coat, and when I saw him last his PVC's were a little worse. We discussed sotalol and amio. In the interim, he has done better with no symptoms. He denies chest pain or sob. He has been under increased stress as his son-in-law has been diagnosed with lymphoma. He has documented white coat HTN.  Allergies  Allergen Reactions   Lipitor [Atorvastatin] Itching and Rash     Current Outpatient Medications  Medication Sig Dispense Refill   aspirin EC 81 MG tablet Take 81 mg by mouth daily.     clopidogrel (PLAVIX) 75 MG tablet Take 1 tablet by mouth once daily 90 tablet 1   EUTHYROX 50 MCG tablet Take 50 mcg by mouth daily.     isosorbide mononitrate (IMDUR) 30 MG 24 hr tablet Take 1 tablet (30 mg total) by mouth daily. Please make yearly appt with Dr. [name] for July before anymore refills. 1st attempt 90 tablet 0   lisinopril (ZESTRIL) 5 MG tablet Take 1 tablet by mouth in the evening 90 tablet 3   nitroGLYCERIN (NITROSTAT) 0.4 MG SL tablet Place 1 tablet (0.4 mg total) under the tongue every 5 (five) minutes as needed for chest pain. 25 tablet 4   pindolol (VISKEN) 5 MG tablet Take 0.5 tablets (2.5 mg total) by mouth at bedtime. 45 tablet 3   rosuvastatin (CRESTOR) 20 MG tablet Take 20 mg by mouth daily.     No current facility-administered medications for this visit.     Past Medical History:  Diagnosis Date   Bradycardia    a. event monitor (8/14):  NSR, sinus brady, very frequent PACs and occasional atrial runs (3-4 beats).  No significant pauses.   Clavicle fracture    left   Coronary artery disease    a. s/p CABG 1994 // b. Abnl MV 08/2008: Ant-lat ischemia, EF 57%=>LHC 7/10: Xience DES x2 to bifurc lesion of LCx/OM // c. Abnl MV 08/2012-> cath with interval occ of SVG-RCA, CTO of LAD/RCA, severe prox LCx stenosis s/p FFR-guided DES to LCx // e. LHC 8/17: LM stent ok, oLAD 100,  dLAD 45, RIMA-LAD ok, oLCx stent ok, OM1 stent ok, oRCA 100, S-RCA 100, L-R collats, EF 45-50 >> med Rx    GERD (gastroesophageal reflux disease)    Hyperlipidemia    Hypertension    mild hypertension   Ischemic cardiomyopathy    a. Echo 9/17: EF 40-45%, anteroseptal HK, grade 2 diastolic dysfunction, mild AI, mild LAE // Echo 08/2018: EF 50-55, normal diast function, no RWMA, RVSP 27.3, mild LAE, trivial AI, mild MR   Myoview    Myoview 08/2018: EF 49, inf/inf-lat/apical inf scar; no ischemia; Low Risk   PVC's (premature ventricular contractions)    Event monitor 09/2018: normal sinus rhythm, freq PVCs (burden 20%)   Thrombocytopenia ([place])    a. Noted on labs 08/2012.    ROS:   All systems reviewed and negative except as noted in the HPI.   Past Surgical History:  Procedure Laterality Date   CARDIAC CATHETERIZATION     His most recent in August of 2010 showed totally occluded LAD,   CARDIAC CATHETERIZATION N/A 10/14/2015   Procedure: Left Heart Cath and Coronary Angiography;  Surgeon: Leonie Man, MD;  Location: [place] [facility];  Service: Cardiovascular;  Laterality: N/A;   COLONOSCOPY     CORONARY ANGIOPLASTY  CORONARY ARTERY BYPASS GRAFT  1993   RIMA-LAD, SVG-1ST, 2ND DIAG, SVG-1ST, 2 ND OM, PD   CORONARY STENT PLACEMENT  7/10   Stent- Left Cirx   FRACTIONAL FLOW RESERVE WIRE N/A 08/29/2012   Procedure: FRACTIONAL FLOW RESERVE WIRE;  Surgeon: Sherren Mocha, MD;  Location: [facility] CATH LAB;  Service: Cardiovascular;  Laterality: N/A;   FRACTURE SURGERY     right   ORIF CLAVICULAR FRACTURE Left 05/21/2014   Procedure: OPEN REDUCTION INTERNAL FIXATION (ORIF) CLAVICULAR FRACTURE;  Surgeon: Tania Ade, MD;  Location: [facility];  Service: Orthopedics;  Laterality: Left;  Open reduction internal fixation clavical fracture   US ECHOCARDIOGRAPHY  10-29-2007   EF 55-60%     Family History  Problem Relation Age of Onset   Heart disease Brother    Stroke Other      Hypertension Other      Social History   Socioeconomic History   Marital status: Married    Spouse name: Not on file   Number of children: Not on file   Years of education: Not on file   Highest education level: Not on file  Occupational History   Not on file  Tobacco Use   Smoking status: Former Smoker    Quit date: 02/14/1963    Years since quitting: 56.4   Smokeless tobacco: Never Used  Substance and Sexual Activity   Alcohol use: No   Drug use: No   Sexual activity: Not on file  Other Topics Concern   Not on file  Social History Narrative   Not on file   Social Determinants of Health   Financial Resource Strain:    Difficulty of Paying Living Expenses:   Food Insecurity:    Worried About Charity fundraiser in the Last Year:    Arboriculturist in the Last Year:   Transportation Needs:    Film/video editor (Medical):    Lack of Transportation (Non-Medical):   Physical Activity:    Days of Exercise per Week:    Minutes of Exercise per Session:   Stress:    Feeling of Stress :   Social Connections:    Frequency of Communication with Friends and Family:    Frequency of Social Gatherings with Friends and Family:    Attends Religious Services:    Active Member of Clubs or Organizations:    Attends Music therapist:    Marital Status:   Intimate Partner Violence:    Fear of Current or Ex-Partner:    Emotionally Abused:    Physically Abused:    Sexually Abused:      BP (!) 152/80    Pulse 64    Ht 6' (1.829 m)    Wt 202 lb (91.6 kg)    SpO2 95%    BMI 27.40 kg/m   Physical Exam:  Well appearing NAD HEENT: Unremarkable Neck:  No JVD, no thyromegally Lymphatics:  No adenopathy Back:  No CVA tenderness Lungs:  Clear with no wheezes HEART:  IRegular rate rhythm, no murmurs, no rubs, no clicks Abd:  soft, positive bowel sounds, no organomegally, no rebound, no guarding Ext:  2 plus pulses, no edema, no  cyanosis, no clubbing Skin:  No rashes no nodules Neuro:  CN II through XII intact, motor grossly intact   Assess/Plan: 1. PVC's - his symptoms are improved. He will continue his current meds.  2. CAD - he denies anginal symptoms. 3. HTN - his bp is elevated in  the doctor's office but not in his house.   Mikle Bosworth.D.

## 2019-08-14 NOTE — Progress Notes (Signed)
Cardiology Office Note:    Date:  08/15/2019   ID:  Brett Chambers, DOB 01-Feb-1941, MRN [id number]  PCP:  [name], MD  Cardiologist:  Mertie Moores, MD   Referring MD: [name], MD   Chief Complaint  Patient presents with   Coronary Artery Disease   Hyperlipidemia    Notes from [name], [place]    Brett Chambers is a 79 y.o. male with a hx of CAD, s/p CABG in 1994, HTN, HL. LHC in 08/2008 demonstrated 2/4 grafts patent with high grade disease at the bifurcation of the CFX and OM.He underwent PCIwith a Xience DES x2.In 7/14, the patient complained of chest discomfort. Stress testing was high risk with anterolateral ischemia. LHC in 7/14 demonstrated occlusion of the SVG-RCA and severe proximal LCx stenosis. He underwent FFR guided PCI with Xience DES to the ostial LCx. PCI of the extensive CTO of the RCA would be technically challenging and this was treated medically.In 8/17, he developed progressively worsening dyspnea on exertion and LHCdemonstrated patent left main, LCx and OM stents as well as patent RIMA-LAD. SVG-RCA was known to be occluded. EF was 45-50%. Medical therapy was continued. Echocardiogram demonstrated EF 40-45% with anteroseptal hypokinesis, mild aortic insufficiency and moderate diastolic dysfunction. He has been intolerant of beta-blockers in the past due to bradycardia and dizziness.  He was last seen in 6/18.  At that time, we decided to try him on Pindolol (due to its intrinsic sympathomimetic activity) to see if he could tolerate it.    Brett Chambers returns for follow-up.  He has had a couple of episodes of lower left-sided discomfort.  He describes this as sharp and it only lasts seconds.  It is not related to any activity.  He has not had any recurrent symptoms of angina.  He denies dyspnea, syncope, PND or significant edema.  He walks 3 miles several times a week.  Prior CV studies:   The following studies were reviewed today:  Echo 10/22/15 EF 40-45%,  ant-septal HK, Gr 2 DD, mild AI, mild LAE  LHC 10/14/15 LM - stent patent LAD ost 100% CTO, dist 45% LCx ostial /prox stent patent with 5% ISR, mid stent patent, OM1 stent patent with 5% ISR, then 30% RCA - ostial 100% CTO with L-R collaterals to RPDA S-RCA 100% RIMA-LAD ok EF 45-50% No change c/w 7/14>>Med Rx   Carotid US 6/17 < 40% bilateral ICA stenosis  Event monitor 8/14: NSR, sinus brady, very frequent PACs and occasional atrial runs (3-4 beats).No significant pauses  LHC 08/29/12: LM and oCFX junction 75% LAD ostial occluded CFX ostial 75-80%, CFX stents patent with 50% ISR at prox edge, 40% before stented segment in mid CFX RCA proximal occluded S-OM occluded S-Dx occluded S-RCA occluded new RIMA-LAD patent EF 55%. FFR guided PCI: Xience DES to the ostial CFX.  Myoview 08/2012 High risk stress nuclear study Marked ECG abnormalities Inferior wall infarct base and mid level with severe ischemia in moderate anterolateral sized area from apex to base.  Echo 9/09:  EF 55-60%, mild MR, mild TR, mild AI, mild Ao sclerosis.   Myoview 08/2008 Anterolateral ischemia, EF 57%.   August 27, 2017:  Still very active.  .   No angina  BP and HR are good.   On crestor   August 30, 2018  Brett Chambers is seen today for follow-up of his coronary artery disease.  He has hyperlipidemia.  His heart rate was noted to be very low today.  EKG reveals that he is in bigeminy.   Was seen by [name], PA several weeks ago Has had near syncope.  Is wearing a patch monitor  Has short pains .  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   August 15, 2019: Brett Chambers is seen today for follow-up of his coronary artery disease and coronary artery bypass grafting.  He has a history of hyperlipidemia.    No cp. Has occasional twinges but last for only a split seconds   Past Medical History:  Diagnosis Date   Bradycardia    a. event  monitor (8/14):  NSR, sinus brady, very frequent PACs and occasional atrial runs (3-4 beats).  No significant pauses.   Clavicle fracture    left   Coronary artery disease    a. s/p CABG 1994 // b. Abnl MV 08/2008: Ant-lat ischemia, EF 57%=>LHC 7/10: Xience DES x2 to bifurc lesion of LCx/OM // c. Abnl MV 08/2012-> cath with interval occ of SVG-RCA, CTO of LAD/RCA, severe prox LCx stenosis s/p FFR-guided DES to LCx // e. LHC 8/17: LM stent ok, oLAD 100, dLAD 45, RIMA-LAD ok, oLCx stent ok, OM1 stent ok, oRCA 100, S-RCA 100, L-R collats, EF 45-50 >> med Rx    GERD (gastroesophageal reflux disease)    Hyperlipidemia    Hypertension    mild hypertension   Ischemic cardiomyopathy    a. Echo 9/17: EF 40-45%, anteroseptal HK, grade 2 diastolic dysfunction, mild AI, mild LAE // Echo 08/2018: EF 50-55, normal diast function, no RWMA, RVSP 27.3, mild LAE, trivial AI, mild MR   Myoview    Myoview 08/2018: EF 49, inf/inf-lat/apical inf scar; no ischemia; Low Risk   PVC's (premature ventricular contractions)    Event monitor 09/2018: normal sinus rhythm, freq PVCs (burden 20%)   Thrombocytopenia ([place])    a. Noted on labs 08/2012.    Past Surgical History:  Procedure Laterality Date   CARDIAC CATHETERIZATION     His most recent in August of 2010 showed totally occluded LAD,   CARDIAC CATHETERIZATION N/A 10/14/2015   Procedure: Left Heart Cath and Coronary Angiography;  Surgeon: Leonie Man, MD;  Location: [place] [facility];  Service: Cardiovascular;  Laterality: N/A;   COLONOSCOPY     CORONARY ANGIOPLASTY     CORONARY ARTERY BYPASS GRAFT  1993   RIMA-LAD, SVG-1ST, 2ND DIAG, SVG-1ST, 2 ND OM, PD   CORONARY STENT PLACEMENT  7/10   Stent- Left Cirx   FRACTIONAL FLOW RESERVE WIRE N/A 08/29/2012   Procedure: FRACTIONAL FLOW RESERVE WIRE;  Surgeon: Sherren Mocha, MD;  Location: [facility] CATH LAB;  Service: Cardiovascular;  Laterality: N/A;   FRACTURE SURGERY     right   ORIF CLAVICULAR  FRACTURE Left 05/21/2014   Procedure: OPEN REDUCTION INTERNAL FIXATION (ORIF) CLAVICULAR FRACTURE;  Surgeon: Tania Ade, MD;  Location: [place];  Service: Orthopedics;  Laterality: Left;  Open reduction internal fixation clavical fracture   US ECHOCARDIOGRAPHY  10-29-2007   EF 55-60%    Current Medications: Current Meds  Medication Sig   aspirin EC 81 MG tablet Take 81 mg by mouth daily.   clopidogrel (PLAVIX) 75 MG tablet Take 1 tablet by mouth once daily   EUTHYROX 50 MCG tablet Take 50 mcg by mouth daily.   isosorbide mononitrate (IMDUR) 30 MG 24 hr tablet Take 1 tablet (30 mg total) by mouth daily. Please make yearly appt with Dr.  Savino Whisenant for July before anymore refills. 1st attempt   lisinopril (ZESTRIL) 5 MG tablet Take 1 tablet by mouth in the evening   nitroGLYCERIN (NITROSTAT) 0.4 MG SL tablet Place 1 tablet (0.4 mg total) under the tongue every 5 (five) minutes as needed for chest pain.   pindolol (VISKEN) 5 MG tablet Take 0.5 tablets (2.5 mg total) by mouth at bedtime.   rosuvastatin (CRESTOR) 20 MG tablet Take 20 mg by mouth daily.     Allergies:   Lipitor [atorvastatin]   Social History   Tobacco Use   Smoking status: Former Smoker    Quit date: 02/14/1963    Years since quitting: 56.5   Smokeless tobacco: Never Used  Substance Use Topics   Alcohol use: No   Drug use: No     Family Hx: The patient's family history includes Heart disease in his brother; Hypertension in an other family member; Stroke in an other family member.  ROS:   Please see the history of present illness.    ROS All other systems reviewed and are negative.   EKGs/Labs/Other Test Reviewed:    EKG:   .   Recent Labs: No results found for requested labs within last 8760 hours.   Recent Lipid Panel Lab Results  Component Value Date/Time   CHOL 114 10/23/2016 08:34 AM   TRIG 96 10/23/2016 08:34 AM   HDL 43 10/23/2016 08:34 AM   CHOLHDL 2.7 10/23/2016 08:34 AM   CHOLHDL 3  07/09/2012 07:45 AM   LDLCALC 52 10/23/2016 08:34 AM    Physical Exam:    Physical Exam: Blood pressure 128/68, pulse 60, height 6' (1.829 m), weight 205 lb 6.4 oz (93.2 kg), SpO2 96 %.  GEN:  Well nourished, well developed in no acute distress HEENT: Normal NECK: No JVD; No carotid bruits LYMPHATICS: No lymphadenopathy CARDIAC: RRR , no murmurs, rubs, gallops RESPIRATORY:  Clear to auscultation without rales, wheezing or rhonchi  ABDOMEN: Soft, non-tender, non-distended MUSCULOSKELETAL:  No edema; No deformity  SKIN: Warm and dry NEUROLOGIC:  Alert and oriented x 3    ASSESSMENT:    1. Coronary artery disease involving native coronary artery of native heart without angina pectoris   2. Hypercholesterolemia   3. Hyperlipidemia with target LDL less than 70   4. Essential hypertension    PLAN:    In order of problems listed above:  1.  Coronary artery disease s/pCABG in 1994, DES 2 to the LCx/OM bifurcation in 2010and DES 1 to the ostial LCx in 2014.He has 3 out of 4 grafts known to be occluded.  Cardiac catheterization in 09/2015 demonstrated patent stents in the left main, LCx and OM and patent RIMA-LAD. He has extensive left to right collaterals.  Is not having any angina  Encouraged him to get a bit more exercise    2. Ischemic cardiomyopathy - stable .   Echo in July 2020 showed EF of 50-55%.      3. Essential hypertension - BP is well controlled     4. Hyperlipidemia:  Check lipids today .     5.  Near syncope:     No recent episodes , event monitor last years showed PVCs and no significant arrhythmias.       Medication Adjustments/Labs and Tests Ordered: Current medicines are reviewed at length with the patient today.  Concerns regarding medicines are outlined above.  Tests Ordered: Orders Placed This Encounter  Procedures   Lipid Profile   Basic Metabolic Panel (BMET)  Hepatic function panel   Medication Changes: No orders of the defined  types were placed in this encounter.   Signed, Mertie Moores, MD  08/15/2019 8:42 AM    [facility] [address], [place],  [place]  [address] Phone: [phone]; Fax: [phone]

## 2019-08-15 ENCOUNTER — Ambulatory Visit: Payer: Medicare HMO | Admitting: Cardiovascular Disease

## 2019-08-15 ENCOUNTER — Encounter: Payer: Self-pay | Admitting: Cardiovascular Disease

## 2019-08-15 ENCOUNTER — Other Ambulatory Visit: Payer: Self-pay

## 2019-08-15 VITALS — BP 128/68 | HR 60 | Ht 72.0 in | Wt 205.4 lb

## 2019-08-15 DIAGNOSIS — I2510 Atherosclerotic heart disease of native coronary artery without angina pectoris: Secondary | ICD-10-CM

## 2019-08-15 DIAGNOSIS — E7800 Pure hypercholesterolemia, unspecified: Secondary | ICD-10-CM | POA: Diagnosis not present

## 2019-08-15 DIAGNOSIS — E785 Hyperlipidemia, unspecified: Secondary | ICD-10-CM | POA: Diagnosis not present

## 2019-08-15 DIAGNOSIS — I10 Essential (primary) hypertension: Secondary | ICD-10-CM | POA: Diagnosis not present

## 2019-08-15 DIAGNOSIS — I25119 Atherosclerotic heart disease of native coronary artery with unspecified angina pectoris: Secondary | ICD-10-CM | POA: Diagnosis not present

## 2019-08-15 LAB — HEPATIC FUNCTION PANEL
ALT: 16 IU/L (ref 0–44)
AST: 22 IU/L (ref 0–40)
Albumin: 4.4 g/dL (ref 3.7–4.7)
Alkaline Phosphatase: 99 IU/L (ref 48–121)
Bilirubin Total: 0.4 mg/dL (ref 0.0–1.2)
Bilirubin, Direct: 0.13 mg/dL (ref 0.00–0.40)
Total Protein: 6.8 g/dL (ref 6.0–8.5)

## 2019-08-15 LAB — BASIC METABOLIC PANEL
BUN/Creatinine Ratio: 18 (ref 10–24)
BUN: 26 mg/dL (ref 8–27)
CO2: 22 mmol/L (ref 20–29)
Calcium: 9.4 mg/dL (ref 8.6–10.2)
Chloride: 103 mmol/L (ref 96–106)
Creatinine, Ser: 1.41 mg/dL — ABNORMAL HIGH (ref 0.76–1.27)
GFR calc Af Amer: 54 mL/min/{1.73_m2} — ABNORMAL LOW (ref >59)
GFR calc non Af Amer: 47 mL/min/{1.73_m2} — ABNORMAL LOW (ref >59)
Glucose: 92 mg/dL (ref 65–99)
Potassium: 4.8 mmol/L (ref 3.5–5.2)
Sodium: 138 mmol/L (ref 134–144)

## 2019-08-15 LAB — LIPID PANEL
Chol/HDL Ratio: 3.1 ratio (ref 0.0–5.0)
Cholesterol, Total: 119 mg/dL (ref 100–199)
HDL: 38 mg/dL — ABNORMAL LOW (ref >39)
LDL Chol Calc (NIH): 61 mg/dL (ref 0–99)
Triglycerides: 108 mg/dL (ref 0–149)
VLDL Cholesterol Cal: 20 mg/dL (ref 5–40)

## 2019-08-15 NOTE — Patient Instructions (Signed)
Medication Instructions:  Your physician recommends that you continue on your current medications as directed. Please refer to the Current Medication list given to you today.  *If you need a refill on your cardiac medications before your next appointment, please call your pharmacy*   Lab Work: TODAY - cholesterol, liver panel, basic metabolic panel If you have labs (blood work) drawn today and your tests are completely normal, you will receive your results only by: [place] MyChart Message (if you have MyChart) OR . A paper copy in the mail If you have any lab test that is abnormal or we need to change your treatment, we will call you to review the results.   Testing/Procedures: None Ordered   Follow-Up: At [facility], you and your health needs are our priority.  As part of our continuing mission to provide you with exceptional heart care, we have created designated Provider Care Teams.  These Care Teams include your primary Cardiologist (physician) and Advanced Practice Providers (APPs -  Physician Assistants and Nurse Practitioners) who all work together to provide you with the care you need, when you need it.  Your next appointment:   1 year(s)  The format for your next appointment:   In Person  Provider:   [name], PA-C

## 2019-09-18 ENCOUNTER — Other Ambulatory Visit: Payer: Self-pay | Admitting: Physician Assistant

## 2019-11-06 DIAGNOSIS — R69 Illness, unspecified: Secondary | ICD-10-CM | POA: Diagnosis not present

## 2019-11-27 DIAGNOSIS — R69 Illness, unspecified: Secondary | ICD-10-CM | POA: Diagnosis not present

## 2019-12-04 DIAGNOSIS — R69 Illness, unspecified: Secondary | ICD-10-CM | POA: Diagnosis not present

## 2019-12-22 DIAGNOSIS — E785 Hyperlipidemia, unspecified: Secondary | ICD-10-CM | POA: Diagnosis not present

## 2019-12-22 DIAGNOSIS — Z125 Encounter for screening for malignant neoplasm of prostate: Secondary | ICD-10-CM | POA: Diagnosis not present

## 2019-12-22 DIAGNOSIS — E039 Hypothyroidism, unspecified: Secondary | ICD-10-CM | POA: Diagnosis not present

## 2019-12-28 ENCOUNTER — Other Ambulatory Visit: Payer: Self-pay | Admitting: Cardiovascular Disease

## 2019-12-28 DIAGNOSIS — I2510 Atherosclerotic heart disease of native coronary artery without angina pectoris: Secondary | ICD-10-CM

## 2019-12-29 DIAGNOSIS — E039 Hypothyroidism, unspecified: Secondary | ICD-10-CM | POA: Diagnosis not present

## 2019-12-29 DIAGNOSIS — E785 Hyperlipidemia, unspecified: Secondary | ICD-10-CM | POA: Diagnosis not present

## 2019-12-29 DIAGNOSIS — I255 Ischemic cardiomyopathy: Secondary | ICD-10-CM | POA: Diagnosis not present

## 2019-12-29 DIAGNOSIS — I25810 Atherosclerosis of coronary artery bypass graft(s) without angina pectoris: Secondary | ICD-10-CM | POA: Diagnosis not present

## 2019-12-29 DIAGNOSIS — Z1331 Encounter for screening for depression: Secondary | ICD-10-CM | POA: Diagnosis not present

## 2019-12-29 DIAGNOSIS — K5909 Other constipation: Secondary | ICD-10-CM | POA: Diagnosis not present

## 2019-12-29 DIAGNOSIS — Z0000 Encounter for general adult medical examination without abnormal findings: Secondary | ICD-10-CM | POA: Diagnosis not present

## 2019-12-29 DIAGNOSIS — Z9861 Coronary angioplasty status: Secondary | ICD-10-CM | POA: Diagnosis not present

## 2019-12-29 DIAGNOSIS — D692 Other nonthrombocytopenic purpura: Secondary | ICD-10-CM | POA: Diagnosis not present

## 2019-12-29 DIAGNOSIS — R82998 Other abnormal findings in urine: Secondary | ICD-10-CM | POA: Diagnosis not present

## 2019-12-29 DIAGNOSIS — I10 Essential (primary) hypertension: Secondary | ICD-10-CM | POA: Diagnosis not present

## 2020-01-21 DIAGNOSIS — Z1212 Encounter for screening for malignant neoplasm of rectum: Secondary | ICD-10-CM | POA: Diagnosis not present

## 2020-02-17 ENCOUNTER — Ambulatory Visit: Payer: Medicare HMO | Admitting: Internal Medicine

## 2020-02-17 ENCOUNTER — Encounter: Payer: Self-pay | Admitting: Internal Medicine

## 2020-02-17 ENCOUNTER — Other Ambulatory Visit: Payer: Self-pay

## 2020-02-17 VITALS — BP 154/90 | HR 58 | Ht 72.0 in | Wt 204.4 lb

## 2020-02-17 DIAGNOSIS — I10 Essential (primary) hypertension: Secondary | ICD-10-CM | POA: Diagnosis not present

## 2020-02-17 DIAGNOSIS — I493 Ventricular premature depolarization: Secondary | ICD-10-CM | POA: Diagnosis not present

## 2020-02-17 DIAGNOSIS — I25119 Atherosclerotic heart disease of native coronary artery with unspecified angina pectoris: Secondary | ICD-10-CM

## 2020-02-17 NOTE — Patient Instructions (Signed)

## 2020-02-17 NOTE — Progress Notes (Signed)
HPI Brett Chambers returns today for followup of his PVC's. He has CAD and HTN, mostly white coat, and when I saw him last his PVC's were a better and he has tolerated low dose pindolol. . We discussed sotalol and amio. In the interim, he has done better with no symptoms. He denies chest pain or sob. He has documented white coat HTN. Allergies  Allergen Reactions  . Lipitor [Atorvastatin] Itching and Rash     Current Outpatient Medications  Medication Sig Dispense Refill  . aspirin EC 81 MG tablet Take 81 mg by mouth daily.    . clopidogrel (PLAVIX) 75 MG tablet Take 1 tablet by mouth once daily 90 tablet 3  . EUTHYROX 50 MCG tablet Take 50 mcg by mouth daily.    . isosorbide mononitrate (IMDUR) 30 MG 24 hr tablet TAKE 1 TABLET BY MOUTH ONCE DAILY(MAKE YEARLY APPT WITH DR [name] FOR JULY BEFORE ANYMORE REFILLS 1ST ATTEMPT) 90 tablet 2  . lisinopril (ZESTRIL) 5 MG tablet Take 1 tablet by mouth in the evening 90 tablet 3  . nitroGLYCERIN (NITROSTAT) 0.4 MG SL tablet Place 1 tablet (0.4 mg total) under the tongue every 5 (five) minutes as needed for chest pain. 25 tablet 4  . pindolol (VISKEN) 5 MG tablet Take 0.5 tablets (2.5 mg total) by mouth at bedtime. 45 tablet 3  . rosuvastatin (CRESTOR) 20 MG tablet Take 20 mg by mouth daily.     No current facility-administered medications for this visit.     Past Medical History:  Diagnosis Date  . Bradycardia    a. event monitor (8/14):  NSR, sinus brady, very frequent PACs and occasional atrial runs (3-4 beats).  No significant pauses.  . Clavicle fracture    left  . Coronary artery disease    a. s/p CABG 1994 // b. Abnl MV 08/2008: Ant-lat ischemia, EF 57%=>LHC 7/10: Xience DES x2 to bifurc lesion of LCx/OM // c. Abnl MV 08/2012-> cath with interval occ of SVG-RCA, CTO of LAD/RCA, severe prox LCx stenosis s/p FFR-guided DES to LCx // e. LHC 8/17: LM stent ok, oLAD 100, dLAD 45, RIMA-LAD ok, oLCx stent ok, OM1 stent ok, oRCA 100, S-RCA 100,  L-R collats, EF 45-50 >> med Rx   . GERD (gastroesophageal reflux disease)   . Hyperlipidemia   . Hypertension    mild hypertension  . Ischemic cardiomyopathy    a. Echo 9/17: EF 40-45%, anteroseptal HK, grade 2 diastolic dysfunction, mild AI, mild LAE // Echo 08/2018: EF 50-55, normal diast function, no RWMA, RVSP 27.3, mild LAE, trivial AI, mild MR  . Myoview    Myoview 08/2018: EF 49, inf/inf-lat/apical inf scar; no ischemia; Low Risk  . PVC's (premature ventricular contractions)    Event monitor 09/2018: normal sinus rhythm, freq PVCs (burden 20%)  . Thrombocytopenia ([place])    a. Noted on labs 08/2012.    ROS:   All systems reviewed and negative except as noted in the HPI.   Past Surgical History:  Procedure Laterality Date  . CARDIAC CATHETERIZATION     His most recent in August of 2010 showed totally occluded LAD,  . CARDIAC CATHETERIZATION N/A 10/14/2015   Procedure: Left Heart Cath and Coronary Angiography;  Surgeon: Leonie Man, MD;  Location: [facility];  Service: Cardiovascular;  Laterality: N/A;  . COLONOSCOPY    . CORONARY ANGIOPLASTY    . CORONARY ARTERY BYPASS GRAFT  1993   RIMA-LAD, SVG-1ST, 2ND DIAG,  SVG-1ST, 2 ND OM, PD  . CORONARY STENT PLACEMENT  7/10   Stent- Left Cirx  . FRACTIONAL FLOW RESERVE WIRE N/A 08/29/2012   Procedure: FRACTIONAL FLOW RESERVE WIRE;  Surgeon: [name], MD;  Location: [facility] CATH LAB;  Service: Cardiovascular;  Laterality: N/A;  . FRACTURE SURGERY     right  . ORIF CLAVICULAR FRACTURE Left 05/21/2014   Procedure: OPEN REDUCTION INTERNAL FIXATION (ORIF) CLAVICULAR FRACTURE;  Surgeon: Jones Broom, MD;  Location: [facility] OR;  Service: Orthopedics;  Laterality: Left;  Open reduction internal fixation clavical fracture  . US ECHOCARDIOGRAPHY  10-29-2007   EF 55-60%     Family History  Problem Relation Age of Onset  . Heart disease Brother   . Stroke Other   . Hypertension Other      Social History   Socioeconomic History   . Marital status: Married    Spouse name: Not on file  . Number of children: Not on file  . Years of education: Not on file  . Highest education level: Not on file  Occupational History  . Not on file  Tobacco Use  . Smoking status: Former Smoker    Quit date: 02/14/1963    Years since quitting: 57.0  . Smokeless tobacco: Never Used  Substance and Sexual Activity  . Alcohol use: No  . Drug use: No  . Sexual activity: Not on file  Other Topics Concern  . Not on file  Social History Narrative  . Not on file   Social Determinants of Health   Financial Resource Strain: Not on file  Food Insecurity: Not on file  Transportation Needs: Not on file  Physical Activity: Not on file  Stress: Not on file  Social Connections: Not on file  Intimate Partner Violence: Not on file     BP (!) 154/90   Pulse (!) 58   Ht 6' (1.829 m)   Wt 204 lb 6.4 oz (92.7 kg)   BMI 27.72 kg/m   Physical Exam:  Well appearing NAD HEENT: Unremarkable Neck:  No JVD, no thyromegally Lymphatics:  No adenopathy Back:  No CVA tenderness Lungs:  Clear HEART:  Regular rate rhythm, no murmurs, no rubs, no clicks Abd:  soft, positive bowel sounds, no organomegally, no rebound, no guarding Ext:  2 plus pulses, no edema, no cyanosis, no clubbing Skin:  No rashes no nodules Neuro:  CN II through XII intact, motor grossly intact  EKG - sinus brady  Assess/Plan: 1. PVC's - his symptoms are improved. He will continue his current meds.  2. CAD - he denies anginal symptoms. 3. HTN - his bp is elevated in the doctor's office but not in his house.   Brett Gowda Raynie Steinhaus,MD

## 2020-02-23 MED ORDER — PINDOLOL 5 MG PO TABS: 2.5000 mg | ORAL_TABLET | Freq: Every day | ORAL | 3 refills | Status: DC

## 2020-04-22 ENCOUNTER — Other Ambulatory Visit: Payer: Self-pay | Admitting: Internal Medicine

## 2020-05-06 DIAGNOSIS — M25512 Pain in left shoulder: Secondary | ICD-10-CM | POA: Diagnosis not present

## 2020-05-06 DIAGNOSIS — M542 Cervicalgia: Secondary | ICD-10-CM | POA: Diagnosis not present

## 2020-05-26 ENCOUNTER — Other Ambulatory Visit: Payer: Self-pay | Admitting: Orthopedic Surgery

## 2020-05-27 ENCOUNTER — Other Ambulatory Visit: Payer: Self-pay | Admitting: Orthopedic Surgery

## 2020-06-01 ENCOUNTER — Ambulatory Visit
Admission: RE | Admit: 2020-06-01 | Discharge: 2020-06-01 | Disposition: A | Discharge status: Home / Self Care | Payer: Medicare HMO | Source: Ambulatory Visit | Attending: Orthopedic Surgery | Admitting: Orthopedic Surgery

## 2020-06-01 ENCOUNTER — Other Ambulatory Visit: Payer: Self-pay | Admitting: Orthopedic Surgery

## 2020-06-01 DIAGNOSIS — M542 Cervicalgia: Secondary | ICD-10-CM

## 2020-06-01 DIAGNOSIS — Z87898 Personal history of other specified conditions: Secondary | ICD-10-CM

## 2020-06-01 DIAGNOSIS — R221 Localized swelling, mass and lump, neck: Secondary | ICD-10-CM

## 2020-06-01 DIAGNOSIS — R918 Other nonspecific abnormal finding of lung field: Secondary | ICD-10-CM | POA: Diagnosis not present

## 2020-06-02 ENCOUNTER — Ambulatory Visit
Admission: RE | Admit: 2020-06-02 | Discharge: 2020-06-02 | Disposition: A | Discharge status: Home / Self Care | Payer: Medicare HMO | Source: Ambulatory Visit | Attending: Orthopedic Surgery | Admitting: Orthopedic Surgery

## 2020-06-02 DIAGNOSIS — M542 Cervicalgia: Secondary | ICD-10-CM

## 2020-06-02 DIAGNOSIS — R0789 Other chest pain: Secondary | ICD-10-CM | POA: Diagnosis not present

## 2020-06-02 DIAGNOSIS — R221 Localized swelling, mass and lump, neck: Secondary | ICD-10-CM

## 2020-06-02 MED ORDER — IOPAMIDOL (ISOVUE-300) INJECTION 61%
75.0000 mL | Freq: Once | INTRAVENOUS | Status: AC | PRN
  Administered 2020-06-02: 75 mL via INTRAVENOUS

## 2020-06-15 ENCOUNTER — Other Ambulatory Visit: Payer: Medicare HMO

## 2020-09-21 ENCOUNTER — Other Ambulatory Visit: Payer: Self-pay | Admitting: Cardiovascular Disease

## 2020-11-16 ENCOUNTER — Ambulatory Visit: Payer: Medicare HMO | Admitting: Cardiovascular Disease

## 2020-12-13 ENCOUNTER — Encounter: Payer: Self-pay | Admitting: Cardiovascular Disease

## 2020-12-13 NOTE — Progress Notes (Signed)
Cardiology Office Note:    Date:  12/14/2020   ID:  Brett Chambers, DOB [birth year], MRN [id number]  PCP:  Ginger Organ., MD  Cardiologist:  Mertie Moores, MD   Referring MD: Ginger Organ., MD   Chief Complaint  Patient presents with   Coronary Artery Disease        Hyperlipidemia    Notes from [name], [place]    Brett Chambers is a 80 y.o. male with a hx of CAD, s/p CABG in 1994, HTN, HL. LHC in 08/2008 demonstrated 2/4 grafts patent with high grade disease at the bifurcation of the CFX and OM.  He underwent PCI with a Xience DES x2.  In 7/14, the patient complained of chest discomfort. Stress testing was high risk with anterolateral ischemia. LHC in 7/14 demonstrated occlusion of the SVG-RCA and severe proximal LCx stenosis. He underwent FFR guided PCI with Xience DES to the ostial LCx. PCI of the extensive CTO of the RCA would be technically challenging and this was treated medically.  In 8/17, he developed progressively worsening dyspnea on exertion and LHC demonstrated patent left main, LCx and OM stents as well as patent RIMA-LAD. SVG-RCA was known to be occluded. EF was 45-50%. Medical therapy was continued. Echocardiogram demonstrated EF 40-45% with anteroseptal hypokinesis, mild aortic insufficiency and moderate diastolic dysfunction. He has been intolerant of beta-blockers in the past due to bradycardia and dizziness.  He was last seen in 6/18.  At that time, we decided to try him on Pindolol (due to its intrinsic sympathomimetic activity) to see if he could tolerate it.    Brett Chambers returns for follow-up.  He has had a couple of episodes of lower left-sided discomfort.  He describes this as sharp and it only lasts seconds.  It is not related to any activity.  He has not had any recurrent symptoms of angina.  He denies dyspnea, syncope, PND or significant edema.  He walks 3 miles several times a week.  Prior CV studies:   The following studies were reviewed today:  Echo  10/22/15 EF 40-45%, ant-septal HK, Gr 2 DD, mild AI, mild LAE   LHC 10/14/15 LM - stent patent LAD ost 100% CTO, dist 45% LCx ostial /prox stent patent with 5% ISR, mid stent patent, OM1 stent patent with 5% ISR, then 30% RCA - ostial 100% CTO with L-R collaterals to RPDA S-RCA 100% RIMA-LAD ok EF 45-50% No change c/w 7/14>>Med Rx    Carotid US 6/17 < 40% bilateral ICA stenosis   Event monitor 8/14:    NSR, sinus brady, very frequent PACs and occasional atrial runs (3-4 beats).  No significant pauses   LHC 08/29/12:  LM and oCFX junction 75% LAD ostial occluded CFX ostial 75-80%, CFX stents patent with 50% ISR at prox edge, 40% before stented segment in mid CFX RCA proximal occluded S-OM occluded S-Dx occluded S-RCA occluded new RIMA-LAD patent EF 55%. FFR guided PCI:  Xience DES to the ostial CFX.   Myoview 08/2012 High risk stress nuclear study Marked ECG abnormalities Inferior wall infarct base and mid level with severe ischemia in moderate anterolateral sized area from apex to base.   Echo 9/09:  EF 55-60%, mild MR, mild TR, mild AI, mild Ao sclerosis.    Myoview 08/2008 Anterolateral ischemia, EF 57%.   August 27, 2017:  Still very active.  .   No angina  BP and HR are good.   On crestor  August 30, 2018  Brett Chambers is seen today for follow-up of his coronary artery disease.  He has hyperlipidemia.  His heart rate was noted to be very low today.  EKG reveals that he is in bigeminy.   Was seen by [name], PA several weeks ago Has had near syncope.  Is wearing a patch monitor  Has short pains .  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   August 15, 2019: Brett Chambers is seen today for follow-up of his coronary artery disease and coronary artery bypass grafting.  He has a history of hyperlipidemia.    No cp. Has occasional twinges but last for only a split seconds   Nov. 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  I had him do leg raises for about 30 seconds.  His heart rate increased to a sinus rhythm at around 80-90.  He has no symptoms of palpitations.  Past Medical History:  Diagnosis Date   Bradycardia    a. event monitor (8/14):  NSR, sinus brady, very frequent PACs and occasional atrial runs (3-4 beats).  No significant pauses.   Clavicle fracture    left   Coronary artery disease    a. s/p CABG 1994 // b. Abnl MV 08/2008: Ant-lat ischemia, EF 57%=>LHC 7/10: Xience DES x2 to bifurc lesion of LCx/OM // c. Abnl MV 08/2012-> cath with interval occ of SVG-RCA, CTO of LAD/RCA, severe prox LCx stenosis s/p FFR-guided DES to LCx // e. LHC 8/17: LM stent ok, oLAD 100, dLAD 45, RIMA-LAD ok, oLCx stent ok, OM1 stent ok, oRCA 100, S-RCA 100, L-R collats, EF 45-50 >> med Rx    GERD (gastroesophageal reflux disease)    Hyperlipidemia    Hypertension    mild hypertension   Ischemic cardiomyopathy    a. Echo 9/17: EF 40-45%, anteroseptal HK, grade 2 diastolic dysfunction, mild AI, mild LAE // Echo 08/2018: EF 50-55, normal diast function, no RWMA, RVSP 27.3, mild LAE, trivial AI, mild MR   Myoview    Myoview 08/2018: EF 49, inf/inf-lat/apical inf scar; no ischemia; Low Risk   PVC's (premature ventricular contractions)    Event monitor 09/2018: normal sinus rhythm, freq PVCs (burden 20%)   Thrombocytopenia ([place])    a. Noted on labs 08/2012.    Past Surgical History:  Procedure Laterality Date   CARDIAC CATHETERIZATION     His most recent in August of 2010 showed totally occluded LAD,   CARDIAC CATHETERIZATION N/A 10/14/2015   Procedure: Left Heart Cath and Coronary Angiography;  Surgeon: Leonie Man, MD;  Location: [facility];  Service: Cardiovascular;  Laterality: N/A;   COLONOSCOPY      CORONARY ANGIOPLASTY     CORONARY ARTERY BYPASS GRAFT  1993   RIMA-LAD, SVG-1ST, 2ND DIAG, SVG-1ST, 2 ND OM, PD   CORONARY STENT PLACEMENT  7/10   Stent- Left Cirx   FRACTIONAL FLOW RESERVE WIRE N/A 08/29/2012   Procedure: FRACTIONAL FLOW RESERVE WIRE;  Surgeon: Sherren Mocha, MD;  Location: [facility] CATH LAB;  Service: Cardiovascular;  Laterality: N/A;   FRACTURE SURGERY     right  ORIF CLAVICULAR FRACTURE Left 05/21/2014   Procedure: OPEN REDUCTION INTERNAL FIXATION (ORIF) CLAVICULAR FRACTURE;  Surgeon: Tania Ade, MD;  Location: [place];  Service: Orthopedics;  Laterality: Left;  Open reduction internal fixation clavical fracture   US ECHOCARDIOGRAPHY  10-29-2007   EF 55-60%    Current Medications: Current Meds  Medication Sig   aspirin EC 81 MG tablet Take 81 mg by mouth daily.   clopidogrel (PLAVIX) 75 MG tablet Take 1 tablet by mouth once daily   EUTHYROX 50 MCG tablet Take 50 mcg by mouth daily.   isosorbide mononitrate (IMDUR) 30 MG 24 hr tablet TAKE 1 TABLET BY MOUTH ONCE DAILY(MAKE YEARLY APPT WITH DR [name] FOR JULY BEFORE ANYMORE REFILLS 1ST ATTEMPT) (Patient taking differently: Take 15 mg by mouth daily.)   lisinopril (ZESTRIL) 5 MG tablet Take 1 tablet by mouth in the evening   nitroGLYCERIN (NITROSTAT) 0.4 MG SL tablet Place 1 tablet (0.4 mg total) under the tongue every 5 (five) minutes as needed for chest pain.   pindolol (VISKEN) 5 MG tablet Take 0.5 tablets (2.5 mg total) by mouth at bedtime.   rosuvastatin (CRESTOR) 20 MG tablet Take 20 mg by mouth daily.     Allergies:   Lipitor [atorvastatin]   Social History   Tobacco Use   Smoking status: Former    Types: Cigarettes    Quit date: 02/14/1963    Years since quitting: 57.8   Smokeless tobacco: Never  Substance Use Topics   Alcohol use: No   Drug use: No     Family Hx: The patient's family history includes Heart disease in his brother; Hypertension in an other family member; Stroke in an other family  member.  ROS:   Please see the history of present illness.    ROS All other systems reviewed and are negative.   EKGs/Labs/Other Test Reviewed:      Recent Labs: No results found for requested labs within last 8760 hours.   Recent Lipid Panel Lab Results  Component Value Date/Time   CHOL 119 08/15/2019 08:29 AM   TRIG 108 08/15/2019 08:29 AM   HDL 38 (L) 08/15/2019 08:29 AM   CHOLHDL 3.1 08/15/2019 08:29 AM   CHOLHDL 3 07/09/2012 07:45 AM   LDLCALC 61 08/15/2019 08:29 AM    Physical Exam:    Physical Exam: Blood pressure 124/60, pulse 67, height 6\' 1"  (1.854 m), weight 205 lb (93 kg), SpO2 98 %.  GEN:  Well nourished, well developed in no acute distress HEENT: Normal NECK: No JVD; No carotid bruits LYMPHATICS: No lymphadenopathy CARDIAC: RRR with frequent premature beats in a ventricular bigeminal pattern  RESPIRATORY:  Clear to auscultation without rales, wheezing or rhonchi  ABDOMEN: Soft, non-tender, non-distended MUSCULOSKELETAL:  No edema; No deformity  SKIN: Warm and dry NEUROLOGIC:  Alert and oriented x 3  I have him do leg lifts for 30 seconds and his ventricular bigeminy resolved.   EKG:  NSR with ventricular bigeminy   ASSESSMENT:    No diagnosis found.  PLAN:    In order of problems listed above:  1.  Coronary artery disease s/p CABG in 1994, DES 2 to the LCx/OM bifurcation in 2010 and DES 1 to the ostial LCx in 2014.   He has 3 out of 4 grafts known to be occluded.   Cardiac catheterization in 09/2015 demonstrated patent stents in the left main, LCx and OM and patent RIMA-LAD. He has extensive left to right collaterals.  Is not having any  angina  Encouraged him to get a bit more exercise    2. Ischemic cardiomyopathy - stable .   Echo in July 2020 showed EF of 50-55%.      3. Essential hypertension - BP is well controlled     4. Hyperlipidemia:  Check lipids today .     5.  Near syncope:     No recent episodes , event monitor last years  showed PVCs and no significant arrhythmias.       Medication Adjustments/Labs and Tests Ordered: Current medicines are reviewed at length with the patient today.  Concerns regarding medicines are outlined above.  Tests Ordered: No orders of the defined types were placed in this encounter.  Medication Changes: No orders of the defined types were placed in this encounter.   Signed, Mertie Moores, MD  12/14/2020 1:47 PM    [facility] [address], [place], [place]  [address] Phone: [phone]; Fax: [phone]

## 2020-12-14 ENCOUNTER — Encounter: Payer: Self-pay | Admitting: Cardiovascular Disease

## 2020-12-14 ENCOUNTER — Ambulatory Visit: Payer: Medicare HMO | Admitting: Cardiovascular Disease

## 2020-12-14 ENCOUNTER — Other Ambulatory Visit: Payer: Self-pay

## 2020-12-14 VITALS — BP 124/60 | HR 67 | Ht 73.0 in | Wt 205.0 lb

## 2020-12-14 DIAGNOSIS — I25119 Atherosclerotic heart disease of native coronary artery with unspecified angina pectoris: Secondary | ICD-10-CM

## 2020-12-14 DIAGNOSIS — I10 Essential (primary) hypertension: Secondary | ICD-10-CM

## 2020-12-14 MED ORDER — ISOSORBIDE MONONITRATE ER 30 MG PO TB24: 15.0000 mg | ORAL_TABLET | Freq: Every day | ORAL | 3 refills | Status: DC

## 2020-12-14 NOTE — Patient Instructions (Signed)
Medication Instructions:  Your physician has recommended you make the following change in your medication:   REDUCE the Isorsobide to 1/2 tablet daily = 15 mg daily   *If you need a refill on your cardiac medications before your next appointment, please call your pharmacy*   Lab Work: None ordered  If you have labs (blood work) drawn today and your tests are completely normal, you will receive your results only by: [place] (if you have MyChart) OR A paper copy in the mail If you have any lab test that is abnormal or we need to change your treatment, we will call you to review the results.   Testing/Procedures: None ordered    Follow-Up: At [facility], you and your health needs are our priority.  As part of our continuing mission to provide you with exceptional heart care, we have created designated Provider Care Teams.  These Care Teams include your primary Cardiologist (physician) and Advanced Practice Providers (APPs -  Physician Assistants and Nurse Practitioners) who all work together to provide you with the care you need, when you need it.  We recommend signing up for the patient portal called "MyChart".  Sign up information is provided on this After Visit Summary.  MyChart is used to connect with patients for Virtual Visits (Telemedicine).  Patients are able to view lab/test results, encounter notes, upcoming appointments, etc.  Non-urgent messages can be sent to your provider as well.   To learn more about what you can do with MyChart, go to [email or web address].    Your next appointment:   1 year(s)  The format for your next appointment:   In Person  Provider:   [name], PA-C   Other Instructions

## 2020-12-17 ENCOUNTER — Ambulatory Visit: Payer: Medicare HMO | Admitting: Cardiovascular Disease

## 2020-12-21 ENCOUNTER — Other Ambulatory Visit: Payer: Self-pay | Admitting: Cardiovascular Disease

## 2021-02-02 DIAGNOSIS — E039 Hypothyroidism, unspecified: Secondary | ICD-10-CM | POA: Diagnosis not present

## 2021-02-02 DIAGNOSIS — E785 Hyperlipidemia, unspecified: Secondary | ICD-10-CM | POA: Diagnosis not present

## 2021-02-02 DIAGNOSIS — I10 Essential (primary) hypertension: Secondary | ICD-10-CM | POA: Diagnosis not present

## 2021-02-02 DIAGNOSIS — Z125 Encounter for screening for malignant neoplasm of prostate: Secondary | ICD-10-CM | POA: Diagnosis not present

## 2021-02-09 DIAGNOSIS — I493 Ventricular premature depolarization: Secondary | ICD-10-CM | POA: Diagnosis not present

## 2021-02-09 DIAGNOSIS — R82998 Other abnormal findings in urine: Secondary | ICD-10-CM | POA: Diagnosis not present

## 2021-02-09 DIAGNOSIS — I700 Atherosclerosis of aorta: Secondary | ICD-10-CM | POA: Diagnosis not present

## 2021-02-09 DIAGNOSIS — Z0000 Encounter for general adult medical examination without abnormal findings: Secondary | ICD-10-CM | POA: Diagnosis not present

## 2021-02-09 DIAGNOSIS — E785 Hyperlipidemia, unspecified: Secondary | ICD-10-CM | POA: Diagnosis not present

## 2021-02-09 DIAGNOSIS — I25810 Atherosclerosis of coronary artery bypass graft(s) without angina pectoris: Secondary | ICD-10-CM | POA: Diagnosis not present

## 2021-02-09 DIAGNOSIS — I129 Hypertensive chronic kidney disease with stage 1 through stage 4 chronic kidney disease, or unspecified chronic kidney disease: Secondary | ICD-10-CM | POA: Diagnosis not present

## 2021-02-09 DIAGNOSIS — N1831 Chronic kidney disease, stage 3a: Secondary | ICD-10-CM | POA: Diagnosis not present

## 2021-02-09 DIAGNOSIS — Z1331 Encounter for screening for depression: Secondary | ICD-10-CM | POA: Diagnosis not present

## 2021-02-09 DIAGNOSIS — E039 Hypothyroidism, unspecified: Secondary | ICD-10-CM | POA: Diagnosis not present

## 2021-02-09 DIAGNOSIS — I255 Ischemic cardiomyopathy: Secondary | ICD-10-CM | POA: Diagnosis not present

## 2021-02-09 DIAGNOSIS — Z9861 Coronary angioplasty status: Secondary | ICD-10-CM | POA: Diagnosis not present

## 2021-02-09 DIAGNOSIS — Z1339 Encounter for screening examination for other mental health and behavioral disorders: Secondary | ICD-10-CM | POA: Diagnosis not present

## 2021-02-09 DIAGNOSIS — I10 Essential (primary) hypertension: Secondary | ICD-10-CM | POA: Diagnosis not present

## 2021-02-16 ENCOUNTER — Ambulatory Visit: Payer: Medicare HMO | Admitting: Cardiovascular Disease

## 2021-03-05 ENCOUNTER — Other Ambulatory Visit: Payer: Self-pay | Admitting: Internal Medicine

## 2021-04-19 ENCOUNTER — Other Ambulatory Visit: Payer: Self-pay | Admitting: Internal Medicine

## 2021-05-23 ENCOUNTER — Telehealth: Payer: Self-pay | Admitting: Cardiovascular Disease

## 2021-05-23 ENCOUNTER — Other Ambulatory Visit: Payer: Self-pay | Admitting: Internal Medicine

## 2021-05-23 MED ORDER — LISINOPRIL 5 MG PO TABS: 5.0000 mg | ORAL_TABLET | Freq: Every evening | ORAL | 0 refills | Status: DC

## 2021-05-23 NOTE — Telephone Encounter (Signed)
?*  STAT* If patient is at the pharmacy, call can be transferred to refill team. ? ? ?1. Which medications need to be refilled? (please list name of each medication and dose if known) lisinopril (ZESTRIL) 5 MG tablet ? ?2. Which pharmacy/location (including street and city if local pharmacy) is medication to be sent to? Preferred Pharmacies    ? ?[facility] ([place] [place] [facility] [place]  ? ? ?3. Do they need a 30 day or 90 day supply? 90 day ? ?Pt states that pharmacy only gave him a 15 day supply of mediation. Pt has an appt scheduled for 06/16/21. Please advise ? ?

## 2021-05-23 NOTE — Telephone Encounter (Signed)
Pt's medication was sent to pt's pharmacy as requested. Confirmation received.  °

## 2021-06-12 NOTE — Progress Notes (Signed)
? ?Cardiology Office Note ?Date:  06/16/2021  ?Patient ID:  Brett Chambers, Brett Chambers [birth year], MRN [id number] ?PCP:  Ginger Organ., MD  ?Cardiologist:  Dr. [name] ?Electrophysiologist: Dr. Lovena Le ? ?  ?Chief Complaint: 1 year ? ?History of Present Illness: ?Brett Chambers is a 81 y.o. male with history of CAD (*CABG 2014, PCI 2010, 2014, cath 2017 medical tx), PVCs, HTN, HLD ? ?Aug 2020 monitor noted 20% PVC burden and Pindolol was increased '5mg'$  AM, 2.'5mg'$  PM ? ?He comes in today to be seen for Dr. Lovena Le, last seen by him Jan 2022, at that time minimal symptoms of his PVCs on Pindolol.  Mentioned prior discussion about amio or sotalol, though not pursued with improved burden on the pindolol. ?BP high at MD offices, not at home. ?No changes were made. ? ?Monitor noted 11.6% PVC burden ?No changes made ? ?He saw Dr. [name] 12/14/20, was feeling well, noted to be in V bigeminy, with seated exercising her had good HR increase.  No symptoms reported.  No changes were made, encouraged activity. ?Mentions historically unable to tolerate BB 2/2 bradycardia, and used pindolol for it's intrinsic sympathomimetic activity. ? ? ?TODAY ?He is unaware of his PVCs, even when he knows he is having like at his last visit. ?He does not have the energy he had years ago, but denies any difficulties with his ADLs. ?No CP, palpitations, SOB ?No dizzy spells or near syncope.  Mentions he used to have near fainting type spells a couple years ago, but has not had any in a long time. ? ? ?Past Medical History:  ?Diagnosis Date  ? Bradycardia   ? a. event monitor (8/14):  NSR, sinus brady, very frequent PACs and occasional atrial runs (3-4 beats).  No significant pauses.  ? Clavicle fracture   ? left  ? Coronary artery disease   ? a. s/p CABG 1994 // b. Abnl MV 08/2008: Ant-lat ischemia, EF 57%=>LHC 7/10: Xience DES x2 to bifurc lesion of LCx/OM // c. Abnl MV 08/2012-> cath with interval occ of SVG-RCA, CTO of LAD/RCA, severe prox LCx stenosis s/p  FFR-guided DES to LCx // e. LHC 8/17: LM stent ok, oLAD 100, dLAD 45, RIMA-LAD ok, oLCx stent ok, OM1 stent ok, oRCA 100, S-RCA 100, L-R collats, EF 45-50 >> med Rx   ? GERD (gastroesophageal reflux disease)   ? Hyperlipidemia   ? Hypertension   ? mild hypertension  ? Ischemic cardiomyopathy   ? a. Echo 9/17: EF 40-45%, anteroseptal HK, grade 2 diastolic dysfunction, mild AI, mild LAE // Echo 08/2018: EF 50-55, normal diast function, no RWMA, RVSP 27.3, mild LAE, trivial AI, mild MR  ? Myoview   ? Myoview 08/2018: EF 49, inf/inf-lat/apical inf scar; no ischemia; Low Risk  ? PVC's (premature ventricular contractions)   ? Event monitor 09/2018: normal sinus rhythm, freq PVCs (burden 20%)  ? Thrombocytopenia ([place])   ? a. Noted on labs 08/2012.  ? ? ?Past Surgical History:  ?Procedure Laterality Date  ? CARDIAC CATHETERIZATION    ? His most recent in August of 2010 showed totally occluded LAD,  ? CARDIAC CATHETERIZATION N/A 10/14/2015  ? Procedure: Left Heart Cath and Coronary Angiography;  Surgeon: Leonie Man, MD;  Location: [place] [facility];  Service: Cardiovascular;  Laterality: N/A;  ? COLONOSCOPY    ? CORONARY ANGIOPLASTY    ? CORONARY ARTERY BYPASS GRAFT  1993  ? RIMA-LAD, SVG-1ST, 2ND DIAG, SVG-1ST, 2 ND OM, PD  ?  CORONARY STENT PLACEMENT  7/10  ? Stent- Left Cirx  ? FRACTIONAL FLOW RESERVE WIRE N/A 08/29/2012  ? Procedure: FRACTIONAL FLOW RESERVE WIRE;  Surgeon: Sherren Mocha, MD;  [facility];  Service: Cardiovascular;  Laterality: N/A;  ? FRACTURE SURGERY    ? right  ? ORIF CLAVICULAR FRACTURE Left 05/21/2014  ? Procedure: OPEN REDUCTION INTERNAL FIXATION (ORIF) CLAVICULAR FRACTURE;  Surgeon: Tania Ade, MD;  Location: [place];  Service: Orthopedics;  Laterality: Left;  Open reduction internal fixation clavical fracture  ? US ECHOCARDIOGRAPHY  10-29-2007  ? EF 55-60%  ? ? ?Current Outpatient Medications  ?Medication Sig Dispense Refill  ? aspirin EC 81 MG tablet Take 81 mg by mouth daily.    ?  clopidogrel (PLAVIX) 75 MG tablet Take 1 tablet by mouth once daily 90 tablet 3  ? nitroGLYCERIN (NITROSTAT) 0.4 MG SL tablet Place 1 tablet (0.4 mg total) under the tongue every 5 (five) minutes as needed for chest pain. 25 tablet 4  ? pindolol (VISKEN) 5 MG tablet TAKE 0.5 TABLETS (2.5 MG TOTAL) BY MOUTH AT BEDTIME. 45 tablet 3  ? rosuvastatin (CRESTOR) 20 MG tablet Take 20 mg by mouth daily.    ? isosorbide mononitrate (IMDUR) 30 MG 24 hr tablet Take 0.5 tablets (15 mg total) by mouth daily. 45 tablet 3  ? levothyroxine (SYNTHROID) 75 MCG tablet Take 75 mcg by mouth every morning.    ? lisinopril (ZESTRIL) 5 MG tablet Take 1 tablet (5 mg total) by mouth every evening. 90 tablet 2  ? ?No current facility-administered medications for this visit.  ? ? ?Allergies:   Lipitor [atorvastatin]  ? ?Social History:  The patient  reports that he quit smoking about 58 years ago. His smoking use included cigarettes. He has never used smokeless tobacco. He reports that he does not drink alcohol and does not use drugs.  ? ?Family History:  The patient's family history includes Heart disease in his brother; Hypertension in an other family member; Stroke in an other family member. ? ?ROS:  Please see the history of present illness.    ?All other systems are reviewed and otherwise negative.  ? ?PHYSICAL EXAM:  ?VS:  BP (!) 156/82   Pulse (!) 55   Ht '6\' 1"'$  (1.854 m)   Wt 199 lb 3.2 oz (90.4 kg)   SpO2 98%   BMI 26.28 kg/m?  BMI: Body mass index is 26.28 kg/m?. ?Well nourished, well developed, in no acute distress ?HEENT: normocephalic, atraumatic ?Neck: no JVD, carotid bruits or masses ?Cardiac:  RRR; no significant murmurs, no rubs, or gallops ?Lungs:  CTA b/l, no wheezing, rhonchi or rales ?Abd: soft, nontender ?MS: no deformity or atrophy ?Ext: no edema ?Skin: warm and dry, no rash ?Neuro:  No gross deficits appreciated ?Psych: euthymic mood, full affect ? ? ? ?EKG:  Done today and reviewed by myself shows  ?SB 55bpm, 1st  degree AVblock 288m, PAC, no PVCs ? ?Oct 2020: Monitor ?1. NSR with PVC's and PAC's ?2. NS SVT ?3. No prolonged pauses ? ? ?Aug 2020: monitor ?Sinus rhythm ?Frequent PVCs. PVC burden of 20%. ? ? ?08/21/2018: TTE ? 1. The left ventricle has low normal systolic function, with an ejection  ?fraction of 50-55%. The cavity size was normal. Left ventricular diastolic  ?parameters were normal. There is abnormal septal motion consistent with  ?post-operative status. No  ?evidence of left ventricular regional wall motion abnormalities.  ? 2. The right ventricle has normal systolic function.  The cavity was  ?normal. There is no increase in right ventricular wall thickness. Right  ?ventricular systolic pressure is normal with an estimated pressure of 27.3  ?mmHg.  ? 3. Left atrial size was mildly dilated.  ? 4. The MR jet is centrally-directed.  ? 5. Mild thickening of the aortic valve. Mild calcification of the aortic  ?valve. Aortic valve regurgitation is trivial by color flow Doppler. No  ?stenosis of the aortic valve.  ? ?Echo 10/22/15 ?EF 40-45%, ant-septal HK, Gr 2 DD, mild AI, mild LAE ?  ?LHC 10/14/15 ?LM - stent patent ?LAD ost 100% CTO, dist 45% ?LCx ostial /prox stent patent with 5% ISR, mid stent patent, OM1 stent patent with 5% ISR, then 30% ?RCA - ostial 100% CTO with L-R collaterals to RPDA ?S-RCA 100% ?RIMA-LAD ok ?EF 45-50% ?No change c/w 7/14>>Med Rx ?  ?Carotid US 6/17 ?< 40% bilateral ICA stenosis ?  ?Event monitor 8/14:    ?NSR, sinus brady, very frequent PACs and occasional atrial runs (3-4 beats).  No significant pauses ?  ?LHC 08/29/12:  ?LM and oCFX junction 75% ?LAD ostial occluded ?CFX ostial 75-80%, CFX stents patent with 50% ISR at prox edge, 40% before stented segment in mid CFX ?RCA proximal occluded ?S-OM occluded ?S-Dx occluded ?S-RCA occluded new ?RIMA-LAD patent ?EF 55%. ?FFR guided PCI:  Xience DES to the ostial CFX. ?  ?Myoview 08/2012 ?High risk stress nuclear study Marked ECG abnormalities  Inferior wall infarct base and mid level with severe ischemia in moderate anterolateral sized area from apex to base. ?  ?Echo 9/09:  ?EF 55-60%, mild MR, mild TR, mild AI, mild Ao sclerosis.  ?  ?Myoview 08/2008 ?Ant

## 2021-06-16 ENCOUNTER — Ambulatory Visit: Payer: Medicare HMO | Admitting: Physician Assistant

## 2021-06-16 ENCOUNTER — Encounter: Payer: Self-pay | Admitting: Physician Assistant

## 2021-06-16 VITALS — BP 138/88 | HR 55 | Ht 73.0 in | Wt 199.2 lb

## 2021-06-16 DIAGNOSIS — I2510 Atherosclerotic heart disease of native coronary artery without angina pectoris: Secondary | ICD-10-CM

## 2021-06-16 DIAGNOSIS — I493 Ventricular premature depolarization: Secondary | ICD-10-CM | POA: Diagnosis not present

## 2021-06-16 DIAGNOSIS — I10 Essential (primary) hypertension: Secondary | ICD-10-CM | POA: Diagnosis not present

## 2021-06-16 MED ORDER — LISINOPRIL 5 MG PO TABS: 5.0000 mg | ORAL_TABLET | Freq: Every evening | ORAL | 2 refills | Status: DC

## 2021-06-16 NOTE — Patient Instructions (Signed)
Medication Instructions:  ? ?Your physician recommends that you continue on your current medications as directed. Please refer to the Current Medication list given to you today. ? ? ?*If you need a refill on your cardiac medications before your next appointment, please call your pharmacy* ? ? ?Lab Work: [place] ? ? ?If you have labs (blood work) drawn today and your tests are completely normal, you will receive your results only by: ?MyChart Message (if you have MyChart) OR ?A paper copy in the mail ?If you have any lab test that is abnormal or we need to change your treatment, we will call you to review the results. ? ? ?Testing/Procedures: NONE ORDERED  TODAY ? ? ?Follow-Up: ?At [facility], you and your health needs are our priority.  As part of our continuing mission to provide you with exceptional heart care, we have created designated Provider Care Teams.  These Care Teams include your primary Cardiologist (physician) and Advanced Practice Providers (APPs -  Physician Assistants and Nurse Practitioners) who all work together to provide you with the care you need, when you need it. ? ?We recommend signing up for the patient portal called "MyChart".  Sign up information is provided on this After Visit Summary.  MyChart is used to connect with patients for Virtual Visits (Telemedicine).  Patients are able to view lab/test results, encounter notes, upcoming appointments, etc.  Non-urgent messages can be sent to your provider as well.   ?To learn more about what you can do with MyChart, go to [email or web address].   ? ?Your next appointment:   ?1 year(s) ? ?The format for your next appointment:   ?In Person ? ?Provider:   ?Cristopher Peru, MD   { ? ? ? ?Other Instructions ? ? ?Important Information About [place] ? ? ? ? ?  ?

## 2021-11-15 DIAGNOSIS — I700 Atherosclerosis of aorta: Secondary | ICD-10-CM | POA: Insufficient documentation

## 2021-11-15 NOTE — Progress Notes (Unsigned)
Cardiology Office Note:    Date:  11/16/2021   ID:  SANDERS MANNINEN, DOB 09/30/1940, MRN [id number]  PCP:  Ginger Organ., MD  [place] Providers Cardiologist:  Mertie Moores, MD    Referring MD: Ginger Organ., MD   Chief Complaint:  Follow-up for CAD    Patient Profile: CAD, s/p CABG in 1994 [place] in 08/2008: 2/4 grafts patent with high grade disease at the bifurcation of the CFX and OM >> PCI:  Xience DES x2.  Myoview 7/14: +ant-lat ischemia >> LHC: occlusion of the SVG-RCA and severe proximal LCx stenosis >> FFR guided PCI with Xience DES to the ostial LCx.  PCI of the extensive CTO of the RCA would be technically challenging and this was treated medically.  LHC 8/17:  patent LM, LCx and OM stents as well as patent RIMA-LAD. SVG-RCA was known to be occluded. EF was 45-50%. Medical therapy was continued.  Ischemic CM with EF 40-45 Echocardiogram 7/20: EF 50-55 PVCs Monitor 09/2018: 20% burden >> EP eval (Dr. Lovena Le) Pindolol Rx (dose increased) Monitor 11/2018: 11.6% burden  Hypertension   Hyperlipidemia  Bradycardia Intol of beta-blocker in past; has been able to tolerate Pindolol since 2019 Aortic atherosclerosis   Prior CV Studies: LONG TERM MONITOR (3-7 DAYS) INTERPRETATION 11/26/2018 1. NSR with PVC's and PAC's 2. NS SVT 3. No prolonged pauses  CARDIAC TELEMETRY MONITORING-INTERPRETATION ONLY 09/18/2018  Sinus rhythm  Frequent PVCs. PVC burden of 20%.  GATED SPECT MYO PERF W/LEXISCAN STRESS 1D 08/21/2018 EF 49, inferior infarction, no ischemia; low risk   ECHO COMPLETE WITH IMAGING ENHANCING AGENT 08/21/2018 EF 50-55, normal RVSF, RVSP 27.3, mild LAE, trivial AI   Echo 10/22/15 EF 40-45%, ant-septal HK, Gr 2 DD, mild AI, mild LAE   LHC 10/14/15 LM - stent patent LAD ost 100% CTO, dist 45% LCx ostial /prox stent patent with 5% ISR, mid stent patent, OM1 stent patent with 5% ISR, then 30% RCA - ostial 100% CTO with L-R collaterals to  RPDA S-RCA 100% RIMA-LAD ok EF 45-50% No change c/w 7/14>>Med Rx   Carotid US 6/17 < 40% bilateral ICA stenosis   Event monitor 8/14:    NSR, sinus brady, very frequent PACs and occasional atrial runs (3-4 beats).  No significant pauses   LHC 08/29/12:  LM and oCFX junction 75% LAD ostial occluded CFX ostial 75-80%, CFX stents patent with 50% ISR at prox edge, 40% before stented segment in mid CFX RCA proximal occluded S-OM occluded S-Dx occluded S-RCA occluded new RIMA-LAD patent EF 55%. FFR guided PCI:  Xience DES to the ostial CFX.   Myoview 08/2012 High risk stress nuclear study Marked ECG abnormalities Inferior wall infarct base and mid level with severe ischemia in moderate anterolateral sized area from apex to base.   Echo 9/09:  EF 55-60%, mild MR, mild TR, mild AI, mild Ao sclerosis.    Myoview 08/2008 Anterolateral ischemia, EF 57%.   History of Present Illness:   Brett Chambers is a 81 y.o. male with the above problem list.  He was last seen by Dr. [name] in 12/2020. He returns for f/u.  He is here alone.  Overall, he has been doing very well.  He has not had symptoms of exertional chest discomfort, shortness of breath, syncope, orthopnea, leg edema.  He has an occasional pain in his lower left chest that resolves with positional changes.  This has been present for the last few years without significant change.  Past Medical History:  Diagnosis Date   Bradycardia    a. event monitor (8/14):  NSR, sinus brady, very frequent PACs and occasional atrial runs (3-4 beats).  No significant pauses.   Clavicle fracture    left   Coronary artery disease    a. s/p CABG 1994 // b. Abnl MV 08/2008: Ant-lat ischemia, EF 57%=>LHC 7/10: Xience DES x2 to bifurc lesion of LCx/OM // c. Abnl MV 08/2012-> cath with interval occ of SVG-RCA, CTO of LAD/RCA, severe prox LCx stenosis s/p FFR-guided DES to LCx // e. LHC 8/17: LM stent ok, oLAD 100, dLAD 45, RIMA-LAD ok, oLCx stent ok, OM1  stent ok, oRCA 100, S-RCA 100, L-R collats, EF 45-50 >> med Rx    GERD (gastroesophageal reflux disease)    Hyperlipidemia    Hypertension    mild hypertension   Ischemic cardiomyopathy    a. Echo 9/17: EF 40-45%, anteroseptal HK, grade 2 diastolic dysfunction, mild AI, mild LAE // Echo 08/2018: EF 50-55, normal diast function, no RWMA, RVSP 27.3, mild LAE, trivial AI, mild MR   Myoview    Myoview 08/2018: EF 49, inf/inf-lat/apical inf scar; no ischemia; Low Risk   PVC's (premature ventricular contractions)    Event monitor 09/2018: normal sinus rhythm, freq PVCs (burden 20%)   Thrombocytopenia ([place])    a. Noted on labs 08/2012.   Current Medications: Current Meds  Medication Sig   aspirin EC 81 MG tablet Take 81 mg by mouth daily.   clopidogrel (PLAVIX) 75 MG tablet Take 1 tablet by mouth once daily   isosorbide mononitrate (IMDUR) 30 MG 24 hr tablet Take 0.5 tablets (15 mg total) by mouth daily.   levothyroxine (SYNTHROID) 75 MCG tablet Take 75 mcg by mouth every morning.   lisinopril (ZESTRIL) 5 MG tablet Take 1 tablet (5 mg total) by mouth every evening.   nitroGLYCERIN (NITROSTAT) 0.4 MG SL tablet Place 1 tablet (0.4 mg total) under the tongue every 5 (five) minutes as needed for chest pain.   pindolol (VISKEN) 5 MG tablet TAKE 0.5 TABLETS (2.5 MG TOTAL) BY MOUTH AT BEDTIME.   rosuvastatin (CRESTOR) 20 MG tablet Take 20 mg by mouth daily.    Allergies:   Lipitor [atorvastatin]   Social History   Tobacco Use   Smoking status: Former    Types: Cigarettes    Quit date: 02/14/1963    Years since quitting: 58.7   Smokeless tobacco: Never  Substance Use Topics   Alcohol use: No   Drug use: No    Family Hx: The patient's family history includes Heart disease in his brother; Hypertension in an other family member; Stroke in an other family member.  Review of Systems  Hematologic/Lymphatic: Does not bruise/bleed easily.  Gastrointestinal:  Negative for hematochezia.   Genitourinary:  Negative for hematuria.     EKGs/Labs/Other Test Reviewed:    EKG:  EKG is not ordered today.    Recent Labs: No results found for requested labs within last 365 days.   Recent Lipid Panel No results for input(s): "CHOL", "TRIG", "HDL", "VLDL", "LDLCALC", "LDLDIRECT" in the last 8760 hours.   Risk Assessment/Calculations/Metrics:              Physical Exam:    VS:  BP 128/74   Pulse 66   Ht 6' (1.829 m)   Wt 201 lb 9.6 oz (91.4 kg)   SpO2 98%   BMI 27.34 kg/m     Wt Readings from Last 3 Encounters:  11/16/21 201 lb 9.6 oz (91.4 kg)  06/16/21 199 lb 3.2 oz (90.4 kg)  12/14/20 205 lb (93 kg)    Constitutional:      Appearance: Healthy appearance. Not in distress.  Neck:     Vascular: JVD normal.  Pulmonary:     Effort: Pulmonary effort is normal.     Breath sounds: No wheezing. No rales.  Cardiovascular:     Normal rate. Regular rhythm. Normal S1. Normal S2.      Murmurs: There is no murmur.  Edema:    Peripheral edema absent.  Abdominal:     Palpations: Abdomen is soft.  Skin:    General: Skin is warm and dry.  Neurological:     General: No focal deficit present.     Mental Status: Alert and oriented to person, place and time.         ASSESSMENT & PLAN:   Coronary artery disease involving native heart with angina pectoris ([place]) s/p CABG in 1994, DES 2 to the LCx/OM bifurcation in 2010 and DES 1 to the ostial LCx in 2014.   He has 3 out of 4 grafts known to be occluded.   Cardiac catheterization in 09/2015 demonstrated patent stents in the left main, LCx and OM and patent RIMA-LAD. He has extensive left to right collaterals.   Myoview in 08/2018 was low risk.  He is doing well without anginal symptoms.  Continue aspirin 81 mg daily, Plavix 75 mg daily, isosorbide mononitrate 15 mg daily, pindolol 2.5 mg daily, Crestor 20 mg daily.  PVC's (premature ventricular contractions) Overall well controlled on beta-blocker therapy.  Continue follow-up  with Dr. Lovena Le as planned.  He remains on pindolol 2.5 mg daily.  Aortic atherosclerosis (HCC) Continue aspirin, statin.  Hyperlipidemia with target LDL less than 70 Labs obtained through [facility] personally reviewed and interpreted: 02/02/2021-total cholesterol 110, HDL 40, LDL 55, triglycerides 74, ALT 22.  Lipids optimal.  Continue Crestor 20 mg daily.  Essential hypertension Blood pressure is controlled.  Continue isosorbide mononitrate 15 mg daily, lisinopril 5 mg daily, pindolol 2.5 mg daily.            Dispo:  Return in about 1 year (around 11/17/2022) for Routine Follow Up, w/ Dr. [name], or [name], PA-C.   Medication Adjustments/Labs and Tests Ordered: Current medicines are reviewed at length with the patient today.  Concerns regarding medicines are outlined above.  Tests Ordered: No orders of the defined types were placed in this encounter.  Medication Changes: No orders of the defined types were placed in this encounter.  Signed, [name], PA-C  11/16/2021 10:18 AM    [facility] [address], [place], [place]  [address] Phone: [phone]; Fax: [phone]

## 2021-11-16 ENCOUNTER — Ambulatory Visit: Payer: Medicare HMO | Attending: Physician Assistant | Admitting: Physician Assistant

## 2021-11-16 ENCOUNTER — Encounter: Payer: Self-pay | Admitting: Physician Assistant

## 2021-11-16 VITALS — BP 128/74 | HR 66 | Ht 72.0 in | Wt 201.6 lb

## 2021-11-16 DIAGNOSIS — I25119 Atherosclerotic heart disease of native coronary artery with unspecified angina pectoris: Secondary | ICD-10-CM | POA: Diagnosis not present

## 2021-11-16 DIAGNOSIS — I10 Essential (primary) hypertension: Secondary | ICD-10-CM | POA: Diagnosis not present

## 2021-11-16 DIAGNOSIS — I493 Ventricular premature depolarization: Secondary | ICD-10-CM

## 2021-11-16 DIAGNOSIS — I700 Atherosclerosis of aorta: Secondary | ICD-10-CM | POA: Diagnosis not present

## 2021-11-16 DIAGNOSIS — E785 Hyperlipidemia, unspecified: Secondary | ICD-10-CM | POA: Diagnosis not present

## 2021-11-16 NOTE — Assessment & Plan Note (Signed)
Continue aspirin, statin.  

## 2021-11-16 NOTE — Assessment & Plan Note (Signed)
Labs obtained through [facility] personally reviewed and interpreted: 02/02/2021-total cholesterol 110, HDL 40, LDL 55, triglycerides 74, ALT 22.  Lipids optimal.  Continue Crestor 20 mg daily.

## 2021-11-16 NOTE — Assessment & Plan Note (Signed)
Overall well controlled on beta-blocker therapy.  Continue follow-up with Dr. Lovena Le as planned.  He remains on pindolol 2.5 mg daily.

## 2021-11-16 NOTE — Assessment & Plan Note (Signed)
s/p CABG in 1994, DES 2 to the LCx/OM bifurcation in 2010 and DES 1 to the ostial LCx in 2014.   He has 3 out of 4 grafts known to be occluded.   Cardiac catheterization in 09/2015 demonstrated patent stents in the left main, LCx and OM and patent RIMA-LAD. He has extensive left to right collaterals.   Myoview in 08/2018 was low risk.  He is doing well without anginal symptoms.  Continue aspirin 81 mg daily, Plavix 75 mg daily, isosorbide mononitrate 15 mg daily, pindolol 2.5 mg daily, Crestor 20 mg daily.

## 2021-11-16 NOTE — Patient Instructions (Signed)
Medication Instructions:  Your physician recommends that you continue on your current medications as directed. Please refer to the Current Medication list given to you today.  *If you need a refill on your cardiac medications before your next appointment, please call your pharmacy*   Lab Work: None ordered  If you have labs (blood work) drawn today and your tests are completely normal, you will receive your results only by: [place] (if you have MyChart) OR A paper copy in the mail If you have any lab test that is abnormal or we need to change your treatment, we will call you to review the results.   Testing/Procedures: None ordered   Follow-Up: At [facility], you and your health needs are our priority.  As part of our continuing mission to provide you with exceptional heart care, we have created designated Provider Care Teams.  These Care Teams include your primary Cardiologist (physician) and Advanced Practice Providers (APPs -  Physician Assistants and Nurse Practitioners) who all work together to provide you with the care you need, when you need it.  We recommend signing up for the patient portal called "MyChart".  Sign up information is provided on this After Visit Summary.  MyChart is used to connect with patients for Virtual Visits (Telemedicine).  Patients are able to view lab/test results, encounter notes, upcoming appointments, etc.  Non-urgent messages can be sent to your provider as well.   To learn more about what you can do with MyChart, go to [email or web address].    Your next appointment:   1 year(s)  The format for your next appointment:   In Person  Provider:   Mertie Moores, MD  or [name], PA-C         Other Instructions Please have your labs sent to [place] from the [place]

## 2021-11-16 NOTE — Assessment & Plan Note (Signed)
Blood pressure is controlled.  Continue isosorbide mononitrate 15 mg daily, lisinopril 5 mg daily, pindolol 2.5 mg daily.

## 2021-12-19 ENCOUNTER — Other Ambulatory Visit: Payer: Self-pay | Admitting: Cardiovascular Disease

## 2022-02-24 DIAGNOSIS — Z125 Encounter for screening for malignant neoplasm of prostate: Secondary | ICD-10-CM | POA: Diagnosis not present

## 2022-02-24 DIAGNOSIS — E785 Hyperlipidemia, unspecified: Secondary | ICD-10-CM | POA: Diagnosis not present

## 2022-02-24 DIAGNOSIS — I10 Essential (primary) hypertension: Secondary | ICD-10-CM | POA: Diagnosis not present

## 2022-02-24 DIAGNOSIS — R7989 Other specified abnormal findings of blood chemistry: Secondary | ICD-10-CM | POA: Diagnosis not present

## 2022-02-24 DIAGNOSIS — E039 Hypothyroidism, unspecified: Secondary | ICD-10-CM | POA: Diagnosis not present

## 2022-03-02 ENCOUNTER — Other Ambulatory Visit: Payer: Self-pay | Admitting: Internal Medicine

## 2022-03-03 DIAGNOSIS — I10 Essential (primary) hypertension: Secondary | ICD-10-CM | POA: Diagnosis not present

## 2022-03-03 DIAGNOSIS — I255 Ischemic cardiomyopathy: Secondary | ICD-10-CM | POA: Diagnosis not present

## 2022-03-03 DIAGNOSIS — Z23 Encounter for immunization: Secondary | ICD-10-CM | POA: Diagnosis not present

## 2022-03-03 DIAGNOSIS — I25810 Atherosclerosis of coronary artery bypass graft(s) without angina pectoris: Secondary | ICD-10-CM | POA: Diagnosis not present

## 2022-03-03 DIAGNOSIS — E785 Hyperlipidemia, unspecified: Secondary | ICD-10-CM | POA: Diagnosis not present

## 2022-03-03 DIAGNOSIS — I700 Atherosclerosis of aorta: Secondary | ICD-10-CM | POA: Diagnosis not present

## 2022-03-03 DIAGNOSIS — Z1339 Encounter for screening examination for other mental health and behavioral disorders: Secondary | ICD-10-CM | POA: Diagnosis not present

## 2022-03-03 DIAGNOSIS — D696 Thrombocytopenia, unspecified: Secondary | ICD-10-CM | POA: Diagnosis not present

## 2022-03-03 DIAGNOSIS — N1831 Chronic kidney disease, stage 3a: Secondary | ICD-10-CM | POA: Diagnosis not present

## 2022-03-03 DIAGNOSIS — I493 Ventricular premature depolarization: Secondary | ICD-10-CM | POA: Diagnosis not present

## 2022-03-03 DIAGNOSIS — Z86711 Personal history of pulmonary embolism: Secondary | ICD-10-CM | POA: Diagnosis not present

## 2022-03-03 DIAGNOSIS — Z1331 Encounter for screening for depression: Secondary | ICD-10-CM | POA: Diagnosis not present

## 2022-03-03 DIAGNOSIS — I129 Hypertensive chronic kidney disease with stage 1 through stage 4 chronic kidney disease, or unspecified chronic kidney disease: Secondary | ICD-10-CM | POA: Diagnosis not present

## 2022-03-03 DIAGNOSIS — Z0000 Encounter for general adult medical examination without abnormal findings: Secondary | ICD-10-CM | POA: Diagnosis not present

## 2022-03-03 DIAGNOSIS — E039 Hypothyroidism, unspecified: Secondary | ICD-10-CM | POA: Diagnosis not present

## 2022-03-03 DIAGNOSIS — R82998 Other abnormal findings in urine: Secondary | ICD-10-CM | POA: Diagnosis not present

## 2022-03-03 DIAGNOSIS — I209 Angina pectoris, unspecified: Secondary | ICD-10-CM | POA: Diagnosis not present

## 2022-03-21 ENCOUNTER — Other Ambulatory Visit: Payer: Self-pay | Admitting: Physician Assistant

## 2022-06-19 ENCOUNTER — Ambulatory Visit: Payer: Medicare HMO | Attending: Internal Medicine | Admitting: Internal Medicine

## 2022-06-19 ENCOUNTER — Encounter: Payer: Self-pay | Admitting: Internal Medicine

## 2022-06-19 VITALS — BP 128/70 | HR 66 | Ht 72.0 in | Wt 206.0 lb

## 2022-06-19 DIAGNOSIS — I25119 Atherosclerotic heart disease of native coronary artery with unspecified angina pectoris: Secondary | ICD-10-CM | POA: Diagnosis not present

## 2022-06-19 DIAGNOSIS — I10 Essential (primary) hypertension: Secondary | ICD-10-CM | POA: Diagnosis not present

## 2022-06-19 DIAGNOSIS — I493 Ventricular premature depolarization: Secondary | ICD-10-CM | POA: Diagnosis not present

## 2022-06-19 NOTE — Patient Instructions (Signed)
Medication Instructions:  Your physician recommends that you continue on your current medications as directed. Please refer to the Current Medication list given to you today.  *If you need a refill on your cardiac medications before your next appointment, please call your pharmacy*  Lab Work: None ordered.  If you have labs (blood work) drawn today and your tests are completely normal, you will receive your results only by: MyChart Message (if you have MyChart) OR A paper copy in the mail If you have any lab test that is abnormal or we need to change your treatment, we will call you to review the results.  Testing/Procedures: None ordered.  Follow-Up: At [facility], you and your health needs are our priority.  As part of our continuing mission to provide you with exceptional heart care, we have created designated Provider Care Teams.  These Care Teams include your primary Cardiologist (physician) and Advanced Practice Providers (APPs -  Physician Assistants and Nurse Practitioners) who all work together to provide you with the care you need, when you need it.   Your next appointment:   1 year(s)  The format for your next appointment:   In Person  Provider:   Gregg Taylor, MD{or one of the following Advanced Practice Providers on your designated Care Team:   Renee Ursuy, PA-C Michael "Andy" Tillery, PA-C           

## 2022-06-19 NOTE — Progress Notes (Signed)
HPI [name] returns today for followup of his PVC's. He has CAD and HTN, mostly white coat, and when I saw him last his PVC's were a better and he has tolerated low dose pindolol. . We discussed sotalol and amio. In the interim, he has done better with no symptoms. He denies chest pain or sob. He has documented white coat HTN.  Allergies  Allergen Reactions   Lipitor [Atorvastatin] Itching and Rash     Current Outpatient Medications  Medication Sig Dispense Refill   aspirin EC 81 MG tablet Take 81 mg by mouth daily.     clopidogrel (PLAVIX) 75 MG tablet Take 1 tablet by mouth once daily 90 tablet 3   levothyroxine (SYNTHROID) 75 MCG tablet Take 75 mcg by mouth every morning.     lisinopril (ZESTRIL) 5 MG tablet Take 1 tablet (5 mg total) by mouth every evening. 90 tablet 3   nitroGLYCERIN (NITROSTAT) 0.4 MG SL tablet Place 1 tablet (0.4 mg total) under the tongue every 5 (five) minutes as needed for chest pain. 25 tablet 4   pindolol (VISKEN) 5 MG tablet Take 0.5 tablets (2.5 mg total) by mouth at bedtime. 45 tablet 2   rosuvastatin (CRESTOR) 20 MG tablet Take 20 mg by mouth daily.     isosorbide mononitrate (IMDUR) 30 MG 24 hr tablet Take 0.5 tablets (15 mg total) by mouth daily. 45 tablet 3   No current facility-administered medications for this visit.     Past Medical History:  Diagnosis Date   Bradycardia    a. event monitor (8/14):  NSR, sinus brady, very frequent PACs and occasional atrial runs (3-4 beats).  No significant pauses.   Clavicle fracture    left   Coronary artery disease    a. s/p CABG 1994 // b. Abnl MV 08/2008: Ant-lat ischemia, EF 57%=>LHC 7/10: Xience DES x2 to bifurc lesion of LCx/OM // c. Abnl MV 08/2012-> cath with interval occ of SVG-RCA, CTO of LAD/RCA, severe prox LCx stenosis s/p FFR-guided DES to LCx // e. LHC 8/17: LM stent ok, oLAD 100, dLAD 45, RIMA-LAD ok, oLCx stent ok, OM1 stent ok, oRCA 100, S-RCA 100, L-R collats, EF 45-50 >> med Rx     GERD (gastroesophageal reflux disease)    Hyperlipidemia    Hypertension    mild hypertension   Ischemic cardiomyopathy    a. Echo 9/17: EF 40-45%, anteroseptal HK, grade 2 diastolic dysfunction, mild AI, mild LAE // Echo 08/2018: EF 50-55, normal diast function, no RWMA, RVSP 27.3, mild LAE, trivial AI, mild MR   Myoview    Myoview 08/2018: EF 49, inf/inf-lat/apical inf scar; no ischemia; Low Risk   PVC's (premature ventricular contractions)    Event monitor 09/2018: normal sinus rhythm, freq PVCs (burden 20%)   Thrombocytopenia (HCC)    a. Noted on labs 08/2012.    ROS:   All systems reviewed and negative except as noted in the HPI.   Past Surgical History:  Procedure Laterality Date   CARDIAC CATHETERIZATION     His most recent in August of 2010 showed totally occluded LAD,   CARDIAC CATHETERIZATION N/A 10/14/2015   Procedure: Left Heart Cath and Coronary Angiography;  Surgeon: Marykay Lex, MD;  Location: [facility] INVASIVE CV LAB;  Service: Cardiovascular;  Laterality: N/A;   COLONOSCOPY     CORONARY ANGIOPLASTY     CORONARY ARTERY BYPASS GRAFT  1993   RIMA-LAD, SVG-1ST, 2ND DIAG, SVG-1ST, 2 ND OM, PD  CORONARY STENT PLACEMENT  7/10   Stent- Left Cirx   FRACTIONAL FLOW RESERVE WIRE N/A 08/29/2012   Procedure: FRACTIONAL FLOW RESERVE WIRE;  Surgeon: [name], MD;  Location: [facility] CATH LAB;  Service: Cardiovascular;  Laterality: N/A;   FRACTURE SURGERY     right   ORIF CLAVICULAR FRACTURE Left 05/21/2014   Procedure: OPEN REDUCTION INTERNAL FIXATION (ORIF) CLAVICULAR FRACTURE;  Surgeon: Jones Broom, MD;  Location: [facility] OR;  Service: Orthopedics;  Laterality: Left;  Open reduction internal fixation clavical fracture   US ECHOCARDIOGRAPHY  10-29-2007   EF 55-60%     Family History  Problem Relation Age of Onset   Heart disease Brother    Stroke Other    Hypertension Other      Social History   Socioeconomic History   Marital status: Married    Spouse name: Not on file    Number of children: Not on file   Years of education: Not on file   Highest education level: Not on file  Occupational History   Not on file  Tobacco Use   Smoking status: Former    Types: Cigarettes    Quit date: 02/14/1963    Years since quitting: 59.3   Smokeless tobacco: Never  Substance and Sexual Activity   Alcohol use: No   Drug use: No   Sexual activity: Not on file  Other Topics Concern   Not on file  Social History Narrative   Not on file   Social Determinants of Health   Financial Resource Strain: Not on file  Food Insecurity: Not on file  Transportation Needs: Not on file  Physical Activity: Not on file  Stress: Not on file  Social Connections: Not on file  Intimate Partner Violence: Not on file     BP 128/70   Pulse 66   Ht 6' (1.829 m)   Wt 206 lb (93.4 kg)   SpO2 99%   BMI 27.94 kg/m   Physical Exam:  Well appearing NAD HEENT: Unremarkable Neck:  No JVD, no thyromegally Lymphatics:  No adenopathy Back:  No CVA tenderness Lungs:  Clear HEART:  Regular rate rhythm, no murmurs, no rubs, no clicks Abd:  soft, positive bowel sounds, no organomegally, no rebound, no guarding Ext:  2 plus pulses, no edema, no cyanosis, no clubbing Skin:  No rashes no nodules Neuro:  CN II through XII intact, motor grossly intact  EKG  DEVICE  Normal device function.  See PaceArt for details.   Assess/Plan: 1. PVC's - his symptoms are improved. He will continue his current meds.  2. CAD - he denies anginal symptoms. 3. HTN - his bp is elevated in the doctor's office but not in his house.    Sharlot Gowda Etola Mull,MD

## 2022-06-20 DIAGNOSIS — E039 Hypothyroidism, unspecified: Secondary | ICD-10-CM | POA: Diagnosis not present

## 2022-07-22 ENCOUNTER — Other Ambulatory Visit: Payer: Self-pay | Admitting: Cardiovascular Disease

## 2022-08-18 ENCOUNTER — Other Ambulatory Visit: Payer: Self-pay | Admitting: Cardiovascular Disease

## 2022-12-05 ENCOUNTER — Other Ambulatory Visit: Payer: Self-pay | Admitting: Cardiovascular Disease

## 2022-12-08 ENCOUNTER — Encounter: Payer: Self-pay | Admitting: Cardiovascular Disease

## 2022-12-12 ENCOUNTER — Other Ambulatory Visit: Payer: Self-pay | Admitting: *Deleted

## 2022-12-12 MED ORDER — PINDOLOL 5 MG PO TABS: 2.5000 mg | ORAL_TABLET | Freq: Every day | ORAL | 0 refills | Status: DC

## 2022-12-17 ENCOUNTER — Encounter: Payer: Self-pay | Admitting: Cardiovascular Disease

## 2022-12-17 NOTE — Progress Notes (Unsigned)
Cardiology Office Note:    Date:  12/18/2022   ID:  Brett Chambers, DOB Sep 22, 1940, MRN [id number]  PCP:  Cleatis Polka., MD  Cardiologist:  Kristeen Miss, MD   Referring MD: Cleatis Polka., MD   Chief Complaint  Patient presents with   Coronary Artery Disease        Hyperlipidemia    Notes from Tereso Newcomer, [place]    Brett Chambers is a 82 y.o. male with a hx of CAD, s/p CABG in 1994, HTN, HL. LHC in 08/2008 demonstrated 2/4 grafts patent with high grade disease at the bifurcation of the CFX and OM.  He underwent PCI with a Xience DES x2.  In 7/14, the patient complained of chest discomfort. Stress testing was high risk with anterolateral ischemia. LHC in 7/14 demonstrated occlusion of the SVG-RCA and severe proximal LCx stenosis. He underwent FFR guided PCI with Xience DES to the ostial LCx. PCI of the extensive CTO of the RCA would be technically challenging and this was treated medically.  In 8/17, he developed progressively worsening dyspnea on exertion and LHC demonstrated patent left main, LCx and OM stents as well as patent RIMA-LAD. SVG-RCA was known to be occluded. EF was 45-50%. Medical therapy was continued. Echocardiogram demonstrated EF 40-45% with anteroseptal hypokinesis, mild aortic insufficiency and moderate diastolic dysfunction. He has been intolerant of beta-blockers in the past due to bradycardia and dizziness.  He was last seen in 6/18.  At that time, we decided to try him on Pindolol (due to its intrinsic sympathomimetic activity) to see if he could tolerate it.    [name] returns for follow-up.  He has had a couple of episodes of lower left-sided discomfort.  He describes this as sharp and it only lasts seconds.  It is not related to any activity.  He has not had any recurrent symptoms of angina.  He denies dyspnea, syncope, PND or significant edema.  He walks 3 miles several times a week.  Prior CV studies:   The following studies were reviewed today:  Echo  10/22/15 EF 40-45%, ant-septal HK, Gr 2 DD, mild AI, mild LAE   LHC 10/14/15 LM - stent patent LAD ost 100% CTO, dist 45% LCx ostial /prox stent patent with 5% ISR, mid stent patent, OM1 stent patent with 5% ISR, then 30% RCA - ostial 100% CTO with L-R collaterals to RPDA S-RCA 100% RIMA-LAD ok EF 45-50% No change c/w 7/14>>Med Rx    Carotid US 6/17 < 40% bilateral ICA stenosis   Event monitor 8/14:    NSR, sinus brady, very frequent PACs and occasional atrial runs (3-4 beats).  No significant pauses   LHC 08/29/12:  LM and oCFX junction 75% LAD ostial occluded CFX ostial 75-80%, CFX stents patent with 50% ISR at prox edge, 40% before stented segment in mid CFX RCA proximal occluded S-OM occluded S-Dx occluded S-RCA occluded new RIMA-LAD patent EF 55%. FFR guided PCI:  Xience DES to the ostial CFX.   Myoview 08/2012 High risk stress nuclear study Marked ECG abnormalities Inferior wall infarct base and mid level with severe ischemia in moderate anterolateral sized area from apex to base.   Echo 9/09:  EF 55-60%, mild MR, mild TR, mild AI, mild Ao sclerosis.    Myoview 08/2008 Anterolateral ischemia, EF 57%.   August 27, 2017:  Still very active.  .   No angina  BP and HR are good.   On crestor  August 30, 2018  Brett Chambers is seen today for follow-up of his coronary artery disease.  He has hyperlipidemia.  His heart rate was noted to be very low today.  EKG reveals that he is in bigeminy.   Was seen by Tereso Newcomer, PA several weeks ago Has had near syncope.  Is wearing a patch monitor  Has short pains .  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   August 15, 2019: Brett Chambers is seen today for follow-up of his coronary artery disease and coronary artery bypass grafting.  He has a history of hyperlipidemia.    No cp. Has occasional twinges but last for only a split seconds   Nov. 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  I had him do leg raises for about 30 seconds.  His heart rate increased to a sinus rhythm at around 80-90.  He has no symptoms of palpitations.   Nov. 4, 2024 Brett Chambers is seen for follow up of his CAD, CABG, HLD, PVCs Saw Dr. Ladona Ridgel for his PVCs in May, 2024  No CP , no dyspnea  His presenting symptom prior to his CABG was extreme fatigue  Has not been fatigued.   Is very active   Lipids are managed by Dr. Clelia Croft His last LDL that I see was 2022 - LDL was 55     Past Medical History:  Diagnosis Date   Bradycardia    a. event monitor (8/14):  NSR, sinus brady, very frequent PACs and occasional atrial runs (3-4 beats).  No significant pauses.   Clavicle fracture    left   Coronary artery disease    a. s/p CABG 1994 // b. Abnl MV 08/2008: Ant-lat ischemia, EF 57%=>LHC 7/10: Xience DES x2 to bifurc lesion of LCx/OM // c. Abnl MV 08/2012-> cath with interval occ of SVG-RCA, CTO of LAD/RCA, severe prox LCx stenosis s/p FFR-guided DES to LCx // e. LHC 8/17: LM stent ok, oLAD 100, dLAD 45, RIMA-LAD ok, oLCx stent ok, OM1 stent ok, oRCA 100, S-RCA 100, L-R collats, EF 45-50 >> med Rx    GERD (gastroesophageal reflux disease)    Hyperlipidemia    Hypertension    mild hypertension   Ischemic cardiomyopathy    a. Echo 9/17: EF 40-45%, anteroseptal HK, grade 2 diastolic dysfunction, mild AI, mild LAE // Echo 08/2018: EF 50-55, normal diast function, no RWMA, RVSP 27.3, mild LAE, trivial AI, mild MR   Myoview    Myoview 08/2018: EF 49, inf/inf-lat/apical inf scar; no ischemia; Low Risk   PVC's (premature ventricular contractions)    Event monitor 09/2018: normal sinus rhythm, freq PVCs (burden 20%)   Thrombocytopenia (HCC)    a. Noted on labs 08/2012.    Past Surgical History:  Procedure  Laterality Date   CARDIAC CATHETERIZATION     His most recent in August of 2010 showed totally occluded LAD,   CARDIAC CATHETERIZATION N/A 10/14/2015   Procedure: Left Heart Cath and Coronary Angiography;  Surgeon: Marykay Lex, MD;  Location: [facility] INVASIVE CV LAB;  Service: Cardiovascular;  Laterality: N/A;   COLONOSCOPY  CORONARY ANGIOPLASTY     CORONARY ARTERY BYPASS GRAFT  1993   RIMA-LAD, SVG-1ST, 2ND DIAG, SVG-1ST, 2 ND OM, PD   CORONARY STENT PLACEMENT  7/10   Stent- Left Cirx   FRACTIONAL FLOW RESERVE WIRE N/A 08/29/2012   Procedure: FRACTIONAL FLOW RESERVE WIRE;  Surgeon: [name], MD;  Location: [facility] CATH LAB;  Service: Cardiovascular;  Laterality: N/A;   FRACTURE SURGERY     right   ORIF CLAVICULAR FRACTURE Left 05/21/2014   Procedure: OPEN REDUCTION INTERNAL FIXATION (ORIF) CLAVICULAR FRACTURE;  Surgeon: Jones Broom, MD;  Location: [facility] OR;  Service: Orthopedics;  Laterality: Left;  Open reduction internal fixation clavical fracture   US ECHOCARDIOGRAPHY  10-29-2007   EF 55-60%    Current Medications: Current Meds  Medication Sig   aspirin EC 81 MG tablet Take 81 mg by mouth daily.   clopidogrel (PLAVIX) 75 MG tablet Take 1 tablet (75 mg total) by mouth daily. Please call [phone] to schedule an appointment with Dr. Elease Hashimoto for future refills. Thank you. 1st attempt.   isosorbide mononitrate (IMDUR) 30 MG 24 hr tablet Take 1 tablet (30 mg total) by mouth daily.   lisinopril (ZESTRIL) 5 MG tablet Take 1 tablet (5 mg total) by mouth every evening.   nitroGLYCERIN (NITROSTAT) 0.4 MG SL tablet Place 1 tablet (0.4 mg total) under the tongue every 5 (five) minutes as needed for chest pain.   pindolol (VISKEN) 5 MG tablet Take 0.5 tablets (2.5 mg total) by mouth at bedtime.   rosuvastatin (CRESTOR) 20 MG tablet Take 20 mg by mouth daily.   SYNTHROID 88 MCG tablet Take 1 tablet by mouth daily.     Allergies:   Lipitor [atorvastatin]   Social History   Tobacco Use    Smoking status: Former    Current packs/day: 0.00    Types: Cigarettes    Quit date: 02/14/1963    Years since quitting: 59.8   Smokeless tobacco: Never  Substance Use Topics   Alcohol use: No   Drug use: No     Family Hx: The patient's family history includes Heart disease in his brother; Hypertension in an other family member; Stroke in an other family member.  ROS:   Please see the history of present illness.    ROS All other systems reviewed and are negative.   EKGs/Labs/Other Test Reviewed:      Recent Labs: No results found for requested labs within last 365 days.   Recent Lipid Panel Lab Results  Component Value Date/Time   CHOL 119 08/15/2019 08:29 AM   TRIG 108 08/15/2019 08:29 AM   HDL 38 (L) 08/15/2019 08:29 AM   CHOLHDL 3.1 08/15/2019 08:29 AM   CHOLHDL 3 07/09/2012 07:45 AM   LDLCALC 61 08/15/2019 08:29 AM    Physical Exam:    Physical Exam: Blood pressure 130/82, pulse (!) 56, height 6' 0.5" (1.842 m), weight 203 lb 12.8 oz (92.4 kg), SpO2 97%.  GEN:  Well nourished, well developed in no acute distress HEENT: Normal NECK: No JVD; No carotid bruits LYMPHATICS: No lymphadenopathy CARDIAC: RRR with frequent premature beats in a ventricular bigeminal pattern  RESPIRATORY:  Clear to auscultation without rales, wheezing or rhonchi  ABDOMEN: Soft, non-tender, non-distended MUSCULOSKELETAL:  No edema; No deformity  SKIN: Warm and dry NEUROLOGIC:  Alert and oriented x 3  I have him do leg lifts for 30 seconds and his ventricular bigeminy resolved.   EKG:  NSR with ventricular bigeminy   ASSESSMENT:  No diagnosis found.  PLAN:      1.  Coronary artery disease s/p CABG in 1994, DES 2 to the LCx/OM bifurcation in 2010 and DES 1 to the ostial LCx in 2014.   He has 3 out of 4 grafts known to be occluded.   Cardiac catheterization in 09/2015 demonstrated patent stents in the left main, LCx and OM and patent RIMA-LAD. He has extensive left to right  collaterals.   No angina  Remains avtive     2. Ischemic cardiomyopathy - stable .   Echo in July 2020 showed EF of 50-55%.      3. Essential hypertension -  BP is well controlled.     4. Hyperlipidemia:   lipids are managed by Dr. Clelia Croft     5.  Near syncope:     No recent episodes , event monitor last years showed PVCs and no significant arrhythmias.       Medication Adjustments/Labs and Tests Ordered: Current medicines are reviewed at length with the patient today.  Concerns regarding medicines are outlined above.  Tests Ordered: No orders of the defined types were placed in this encounter.   Medication Changes: No orders of the defined types were placed in this encounter.    Signed, Kristeen Miss, MD  12/18/2022 2:17 PM    [facility] [address] [place], [place], [place]  [address] Phone: [phone]; Fax: [phone]

## 2022-12-18 ENCOUNTER — Ambulatory Visit: Payer: Medicare HMO | Attending: Cardiovascular Disease | Admitting: Cardiovascular Disease

## 2022-12-18 ENCOUNTER — Encounter: Payer: Self-pay | Admitting: Cardiovascular Disease

## 2022-12-18 VITALS — BP 130/82 | HR 56 | Ht 72.5 in | Wt 203.8 lb

## 2022-12-18 DIAGNOSIS — I10 Essential (primary) hypertension: Secondary | ICD-10-CM

## 2022-12-18 DIAGNOSIS — I25119 Atherosclerotic heart disease of native coronary artery with unspecified angina pectoris: Secondary | ICD-10-CM | POA: Diagnosis not present

## 2022-12-18 DIAGNOSIS — I493 Ventricular premature depolarization: Secondary | ICD-10-CM | POA: Diagnosis not present

## 2022-12-18 NOTE — Patient Instructions (Signed)
Follow-Up: At [facility], you and your health needs are our priority.  As part of our continuing mission to provide you with exceptional heart care, we have created designated Provider Care Teams.  These Care Teams include your primary Cardiologist (physician) and Advanced Practice Providers (APPs -  Physician Assistants and Nurse Practitioners) who all work together to provide you with the care you need, when you need it.  Your next appointment:   1 year(s)  Provider:   Bjorn Pippin , MD

## 2023-01-01 ENCOUNTER — Other Ambulatory Visit: Payer: Self-pay | Admitting: Cardiovascular Disease

## 2023-03-08 DIAGNOSIS — I129 Hypertensive chronic kidney disease with stage 1 through stage 4 chronic kidney disease, or unspecified chronic kidney disease: Secondary | ICD-10-CM | POA: Diagnosis not present

## 2023-03-08 DIAGNOSIS — Z125 Encounter for screening for malignant neoplasm of prostate: Secondary | ICD-10-CM | POA: Diagnosis not present

## 2023-03-08 DIAGNOSIS — E039 Hypothyroidism, unspecified: Secondary | ICD-10-CM | POA: Diagnosis not present

## 2023-03-08 DIAGNOSIS — E785 Hyperlipidemia, unspecified: Secondary | ICD-10-CM | POA: Diagnosis not present

## 2023-03-08 DIAGNOSIS — N1831 Chronic kidney disease, stage 3a: Secondary | ICD-10-CM | POA: Diagnosis not present

## 2023-03-13 ENCOUNTER — Other Ambulatory Visit: Payer: Self-pay | Admitting: Physician Assistant

## 2023-03-13 DIAGNOSIS — Z23 Encounter for immunization: Secondary | ICD-10-CM | POA: Diagnosis not present

## 2023-03-13 DIAGNOSIS — Z86711 Personal history of pulmonary embolism: Secondary | ICD-10-CM | POA: Diagnosis not present

## 2023-03-13 DIAGNOSIS — I10 Essential (primary) hypertension: Secondary | ICD-10-CM | POA: Diagnosis not present

## 2023-03-13 DIAGNOSIS — I129 Hypertensive chronic kidney disease with stage 1 through stage 4 chronic kidney disease, or unspecified chronic kidney disease: Secondary | ICD-10-CM | POA: Diagnosis not present

## 2023-03-13 DIAGNOSIS — I700 Atherosclerosis of aorta: Secondary | ICD-10-CM | POA: Diagnosis not present

## 2023-03-13 DIAGNOSIS — E785 Hyperlipidemia, unspecified: Secondary | ICD-10-CM | POA: Diagnosis not present

## 2023-03-13 DIAGNOSIS — I209 Angina pectoris, unspecified: Secondary | ICD-10-CM | POA: Diagnosis not present

## 2023-03-13 DIAGNOSIS — I493 Ventricular premature depolarization: Secondary | ICD-10-CM | POA: Diagnosis not present

## 2023-03-13 DIAGNOSIS — Z0000 Encounter for general adult medical examination without abnormal findings: Secondary | ICD-10-CM | POA: Diagnosis not present

## 2023-03-13 DIAGNOSIS — N1831 Chronic kidney disease, stage 3a: Secondary | ICD-10-CM | POA: Diagnosis not present

## 2023-03-13 DIAGNOSIS — D696 Thrombocytopenia, unspecified: Secondary | ICD-10-CM | POA: Diagnosis not present

## 2023-03-13 DIAGNOSIS — Z1339 Encounter for screening examination for other mental health and behavioral disorders: Secondary | ICD-10-CM | POA: Diagnosis not present

## 2023-03-13 DIAGNOSIS — I25810 Atherosclerosis of coronary artery bypass graft(s) without angina pectoris: Secondary | ICD-10-CM | POA: Diagnosis not present

## 2023-03-13 DIAGNOSIS — I255 Ischemic cardiomyopathy: Secondary | ICD-10-CM | POA: Diagnosis not present

## 2023-03-13 DIAGNOSIS — Z1331 Encounter for screening for depression: Secondary | ICD-10-CM | POA: Diagnosis not present

## 2023-03-13 DIAGNOSIS — R82998 Other abnormal findings in urine: Secondary | ICD-10-CM | POA: Diagnosis not present

## 2023-03-15 ENCOUNTER — Other Ambulatory Visit: Payer: Self-pay | Admitting: Cardiovascular Disease

## 2023-05-15 DIAGNOSIS — I10 Essential (primary) hypertension: Secondary | ICD-10-CM | POA: Diagnosis not present

## 2023-05-15 DIAGNOSIS — I25810 Atherosclerosis of coronary artery bypass graft(s) without angina pectoris: Secondary | ICD-10-CM | POA: Diagnosis not present

## 2023-05-15 DIAGNOSIS — R42 Dizziness and giddiness: Secondary | ICD-10-CM | POA: Diagnosis not present

## 2023-05-15 DIAGNOSIS — I255 Ischemic cardiomyopathy: Secondary | ICD-10-CM | POA: Diagnosis not present

## 2023-05-15 DIAGNOSIS — D696 Thrombocytopenia, unspecified: Secondary | ICD-10-CM | POA: Diagnosis not present

## 2023-05-15 DIAGNOSIS — Z86711 Personal history of pulmonary embolism: Secondary | ICD-10-CM | POA: Diagnosis not present

## 2023-05-15 DIAGNOSIS — I493 Ventricular premature depolarization: Secondary | ICD-10-CM | POA: Diagnosis not present

## 2023-05-15 DIAGNOSIS — E785 Hyperlipidemia, unspecified: Secondary | ICD-10-CM | POA: Diagnosis not present

## 2023-06-05 NOTE — Progress Notes (Unsigned)
 Electrophysiology Office Note:   Date:  06/06/2023  ID:  Brett Chambers, DOB [birth year], MRN [id number]  Primary Cardiologist: Ahmad Alert, MD Electrophysiologist: Manya Sells, MD      History of Present Illness:   Brett Chambers is a 83 y.o. male with h/o PVCs, CAD, and HTN seen today for acute visit due to intermittent dizziness at request of PCP.    Patient reports no further symptoms since his episode a few weeks ago. He got up to urinate in the middle of the night and fell back on to the bed. Brett Chambers again into the floor when he tried to get up. His "thinking" was OK so he didn't seek further care or think he was having a stroke.  He had intermittent dizziness throughout the morning as well as blurry vision. Seen by PCP who felt TIA was less likely and recommended cardiology follow up given frequent PACs and sinus bradycardia on his EKG that day.   Since then, he denies chest pain, palpitations, dyspnea, PND, orthopnea, nausea, vomiting, dizziness, syncope, edema, weight gain, or early satiety.  He states he has had these "dizzy" spells intermittently throughout the years. Last one was several years ago. Denies heavy exertion or skipped meals/lack of hydration the day prior to this episode. No other focal neurologic symptoms.   Review of systems complete and found to be negative unless listed in HPI.   EP Information / Studies Reviewed:    EKG is ordered today. Personal review as below.  EKG Interpretation Date/Time:  Wednesday June 06 2023 08:01:14 EDT Ventricular Rate:  52 PR Interval:  236 QRS Duration:  92 QT Interval:  434 QTC Calculation: 403 R Axis:   25  Text Interpretation: Sinus bradycardia with 1st degree A-V block with Premature atrial complexes When compared with ECG of 21-May-2014 08:21, PR interval has increased Confirmed by Pilar Bridge [phone]) on 06/06/2023 8:12:28 AM    Arrhythmia/Device History No specialty comments available.   Physical Exam:   VS:  BP  132/80   Pulse (!) 52   Ht 6' (1.829 m)   Wt 208 lb (94.3 kg)   SpO2 95%   BMI 28.21 kg/m    Wt Readings from Last 3 Encounters:  06/06/23 208 lb (94.3 kg)  12/18/22 203 lb 12.8 oz (92.4 kg)  06/19/22 206 lb (93.4 kg)     GEN: No acute distress NECK: No JVD; No carotid bruits CARDIAC: Regular rate and rhythm with occasional ectopy, no murmurs, rubs, gallops RESPIRATORY:  Clear to auscultation without rales, wheezing or rhonchi  ABDOMEN: Soft, non-tender, non-distended EXTREMITIES:  No edema; No deformity   ASSESSMENT AND PLAN:    Dizziness Likely multifactorial, but no ability to definitively determine cause while he is not having symptoms.  Encouraged ED follow up if he has any further weakness associated with vision changes. As a negative work up for CVA/TIA would be helpful, but more importantly would not want to miss TIA diagnosis.   His symptoms are rare. It has been nearly 3 weeks with no symptoms, thus we discussed a short term monitor would likely have little benefit.  Given "years" since prior spells, if pt has recurrence would recommend loop recorder at that time. Pt is in agreement.   PVCs PACs EKG today with PACs  HTN Stable on current regimen   CAD Denies s/s ischemia   Follow up with EP APP in 6 months - Sooner if recurrent symptoms and will submit to insurance for potential loop  for the 6 month visit once scheduled.   Signed, Tylene Galla, PA-C

## 2023-06-06 ENCOUNTER — Encounter: Payer: Self-pay | Admitting: Student

## 2023-06-06 ENCOUNTER — Ambulatory Visit: Attending: Student | Admitting: Student

## 2023-06-06 VITALS — BP 132/80 | HR 52 | Ht 72.0 in | Wt 208.0 lb

## 2023-06-06 DIAGNOSIS — I493 Ventricular premature depolarization: Secondary | ICD-10-CM

## 2023-06-06 DIAGNOSIS — I10 Essential (primary) hypertension: Secondary | ICD-10-CM | POA: Diagnosis not present

## 2023-06-06 DIAGNOSIS — I25119 Atherosclerotic heart disease of native coronary artery with unspecified angina pectoris: Secondary | ICD-10-CM

## 2023-06-06 NOTE — Patient Instructions (Signed)
 Medication Instructions:  Your physician recommends that you continue on your current medications as directed. Please refer to the Current Medication list given to you today.  *If you need a refill on your cardiac medications before your next appointment, please call your pharmacy*  Lab Work: None ordered If you have labs (blood work) drawn today and your tests are completely normal, you will receive your results only by: MyChart Message (if you have MyChart) OR A paper copy in the mail If you have any lab test that is abnormal or we need to change your treatment, we will call you to review the results.  Follow-Up: At [facility], you and your health needs are our priority.  As part of our continuing mission to provide you with exceptional heart care, our providers are all part of one team.  This team includes your primary Cardiologist (physician) and Advanced Practice Providers or APPs (Physician Assistants and Nurse Practitioners) who all work together to provide you with the care you need, when you need it.  Your next appointment:   6 month(s)  Provider:   Joycelyn Noa" Percell Boyers, PA-C with possible loop implant  We recommend signing up for the patient portal called "MyChart".  Sign up information is provided on this After Visit Summary.  MyChart is used to connect with patients for Virtual Visits (Telemedicine).  Patients are able to view lab/test results, encounter notes, upcoming appointments, etc.  Non-urgent messages can be sent to your provider as well.   To learn more about what you can do with MyChart, go to [email or web address].     1st Floor: - Lobby - Registration  - Pharmacy  - Lab - Cafe  2nd Floor: - PV Lab - Diagnostic Testing (echo, CT, nuclear med)  3rd Floor: - Vacant  4th Floor: - TCTS (cardiothoracic surgery) - AFib Clinic - Structural Heart Clinic - Vascular Surgery  - Vascular Ultrasound  5th Floor: - HeartCare Cardiology (general  and EP) - Clinical Pharmacy for coumadin, hypertension, lipid, weight-loss medications, and med management appointments    Valet parking services will be available as well.

## 2023-07-16 ENCOUNTER — Ambulatory Visit: Admitting: Student

## 2023-07-31 ENCOUNTER — Encounter: Payer: Self-pay | Admitting: Cardiovascular Disease

## 2023-08-07 ENCOUNTER — Other Ambulatory Visit: Payer: Self-pay | Admitting: Cardiovascular Disease

## 2023-11-29 NOTE — Progress Notes (Signed)
 Brett Chambers                                          MRN: [id number]   11/29/2023   The [facility] Quality Team Specialist reviewed this patient medical record for the purposes of chart review for care gap closure. The following were reviewed: CBP    [facility]I Quality Team

## 2023-12-04 ENCOUNTER — Other Ambulatory Visit: Payer: Self-pay | Admitting: Physician Assistant

## 2023-12-17 ENCOUNTER — Encounter: Payer: Self-pay | Admitting: *Deleted

## 2023-12-19 ENCOUNTER — Ambulatory Visit: Attending: Cardiology | Admitting: Internal Medicine

## 2023-12-19 ENCOUNTER — Encounter: Payer: Self-pay | Admitting: Internal Medicine

## 2023-12-19 ENCOUNTER — Other Ambulatory Visit: Payer: Self-pay | Admitting: Internal Medicine

## 2023-12-19 VITALS — BP 140/80 | HR 64 | Ht 72.0 in | Wt 209.0 lb

## 2023-12-19 DIAGNOSIS — I2510 Atherosclerotic heart disease of native coronary artery without angina pectoris: Secondary | ICD-10-CM | POA: Diagnosis not present

## 2023-12-19 DIAGNOSIS — I255 Ischemic cardiomyopathy: Secondary | ICD-10-CM | POA: Diagnosis not present

## 2023-12-19 DIAGNOSIS — I10 Essential (primary) hypertension: Secondary | ICD-10-CM | POA: Diagnosis not present

## 2023-12-19 NOTE — Progress Notes (Signed)
 Cardiology Office Note   Date:  12/19/2023  ID:  Brett Chambers, DOB 04-21-1940, MRN [id number] PCP: Loreli Elsie JONETTA Mickey., MD  [facility] Providers Cardiologist:  Aleene Passe, MD (Inactive) Electrophysiologist:  Danelle Birmingham, MD     History of Present Illness Brett Chambers is a 83 y.o. male with past medical history of PVCs, aortic atherosclerosis, CKD 3, CAD, s/p CABG in 1994, hypertension, hyperlipidemia who presents today for annual follow-up.   Notable prior cardiac history:  LHC in 08/2008 demonstrated 2/4 grafts patent with high grade disease at the bifurcation of the CFX and OM.  He underwent PCI with a Xience DES x2.  In 7/14, the patient complained of chest discomfort. Stress testing was high risk with anterolateral ischemia. LHC in 7/14 demonstrated occlusion of the SVG-RCA and severe proximal LCx stenosis. He underwent FFR guided PCI with Xience DES to the ostial LCx. PCI of the extensive CTO of the RCA would be technically challenging and this was treated medically.  In 8/17, he developed progressively worsening dyspnea on exertion and LHC demonstrated patent left main, LCx and OM stents as well as patent RIMA-LAD. SVG-RCA was known to be occluded. EF was 45-50%. Medical therapy was continued. Echocardiogram demonstrated EF 40-45% with anteroseptal hypokinesis, mild aortic insufficiency and moderate diastolic dysfunction. He has been intolerant of beta-blockers in the past due to bradycardia and dizziness.   Previously seen in the EP clinic on 06/06/2023 with Ozell Passey, PA due to episodes of dizziness.  Also seen on 12/18/2022 with Dr. Passe and was noted to be stable at that time.  Today, he is doing well overall without any recurrent dizziness.  No chest pain or shortness of breath.  He is able to ride a motorcycle.  Denies any complaints.  Reports that his blood pressures at home are well-controlled with systolics in the 115's to 120s and occasionally gets elevated when  he goes to a doctor.  He gets some of his care through the [facility] and will try and get us  his records.      ROS:  Review of Systems  All other systems reviewed and are negative.   Physical Exam  Physical Exam Vitals and nursing note reviewed.  Constitutional:      Appearance: Normal appearance.  HENT:     Head: Normocephalic and atraumatic.  Eyes:     Conjunctiva/sclera: Conjunctivae normal.  Neck:     Vascular: No carotid bruit.  Cardiovascular:     Comments: Bradycardic with extra weak systoles following each regular systole Pulmonary:     Effort: Pulmonary effort is normal.     Breath sounds: Normal breath sounds.  Musculoskeletal:        General: No swelling or tenderness.  Skin:    Coloration: Skin is not jaundiced or pale.  Neurological:     Mental Status: He is alert.     VS:  BP (!) 140/80 (BP Location: Left Arm, Patient Position: Sitting, Cuff Size: Normal)   Pulse 64   Ht 6' (1.829 m)   Wt 209 lb (94.8 kg)   BMI 28.35 kg/m         Wt Readings from Last 3 Encounters:  12/19/23 209 lb (94.8 kg)  06/06/23 208 lb (94.3 kg)  12/18/22 203 lb 12.8 oz (92.4 kg)     EKG Interpretation Date/Time:    Ventricular Rate:    PR Interval:    QRS Duration:    QT Interval:    QTC Calculation:  R Axis:      Text Interpretation:      Studies Reviewed  Echocardiogram 09-01-18:  1. The left ventricle has low normal systolic function, with an ejection  fraction of 50-55%. The cavity size was normal. Left ventricular diastolic  parameters were normal. There is abnormal septal motion consistent with  post-operative status. No  evidence of left ventricular regional wall motion abnormalities.   2. The right ventricle has normal systolic function. The cavity was  normal. There is no increase in right ventricular wall thickness. Right  ventricular systolic pressure is normal with an estimated pressure of 27.3  mmHg.   3. Left atrial size was mildly dilated.    4. The MR jet is centrally-directed.   5. Mild thickening of the aortic valve. Mild calcification of the aortic  valve. Aortic valve regurgitation is trivial by color flow Doppler. No  stenosis of the aortic valve.    LHC 10/14/15 LM - stent patent LAD ost 100% CTO, dist 45% LCx ostial /prox stent patent with 5% ISR, mid stent patent, OM1 stent patent with 5% ISR, then 30% RCA - ostial 100% CTO with L-R collaterals to RPDA S-RCA 100% RIMA-LAD ok EF 45-50% No change c/w 7/14>>Med Rx    Carotid US  6/17 < 40% bilateral ICA stenosis   Event monitor 8/14:    NSR, sinus brady, very frequent PACs and occasional atrial runs (3-4 beats).  No significant pauses   LHC 08/29/12:  LM and oCFX junction 75% LAD ostial occluded CFX ostial 75-80%, CFX stents patent with 50% ISR at prox edge, 40% before stented segment in mid CFX RCA proximal occluded S-OM occluded S-Dx occluded S-RCA occluded new RIMA-LAD patent EF 55%. FFR guided PCI:  Xience DES to the ostial CFX.   Myoview  08/2012 High risk stress nuclear study Marked ECG abnormalities Inferior wall infarct base and mid level with severe ischemia in moderate anterolateral sized area from apex to base.   Echo 9/09:  EF 55-60%, mild MR, mild TR, mild AI, mild Ao sclerosis.    Myoview  08/2008 Anterolateral ischemia, EF 57%.   Risk Assessment/Calculations    ASCVD risk score: The ASCVD Risk score (Arnett DK, et al., 2019) failed to calculate for the following reasons:   The 2019 ASCVD risk score is only valid for ages 32 to 46   ASSESSMENT  CAD s/p CABG in 1994 with multiple PCI over the years, without angina s/p CABG in 1994, DES 2 to the LCx/OM bifurcation in 2010 and DES 1 to the ostial LCx in 2014.   He has 3 out of 4 grafts known to be occluded.   Cardiac catheterization in 09/2015 demonstrated patent stents in the left main, LCx and OM and patent RIMA-LAD. He has extensive left to right collaterals.  Overall stable.  On  aspirin , Plavix  Hypertension elevated in the office today but is well-controlled at home and frequently checks his pressures.  Currently on lisinopril  5 mg, Imdur  30 mg, pindolol  2.5 mg Hyperlipidemia is going to get records from the [facility] Extrasystoles on exam, likely due to PACs or PVCs which are known Ischemic cardiomyopathy, stable   Plan  Goal LDL less than 55 Continue current management  Follow up: 1 year          Signed, Emeline FORBES Calender, MD

## 2023-12-19 NOTE — Patient Instructions (Signed)
 Medication Instructions:  Your physician recommends that you continue on your current medications as directed. Please refer to the Current Medication list given to you today.  *If you need a refill on your cardiac medications before your next appointment, please call your pharmacy*  Lab Work: NONE If you have labs (blood work) drawn today and your tests are completely normal, you will receive your results only by: MyChart Message (if you have MyChart) OR A paper copy in the mail If you have any lab test that is abnormal or we need to change your treatment, we will call you to review the results.  Testing/Procedures: NONE  Follow-Up: At [facility], you and your health needs are our priority.  As part of our continuing mission to provide you with exceptional heart care, our providers are all part of one team.  This team includes your primary Cardiologist (physician) and Advanced Practice Providers or APPs (Physician Assistants and Nurse Practitioners) who all work together to provide you with the care you need, when you need it.  Your next appointment:   1 year(s)  Provider:   Kriste, MD  We recommend signing up for the patient portal called MyChart.  Sign up information is provided on this After Visit Summary.  MyChart is used to connect with patients for Virtual Visits (Telemedicine).  Patients are able to view lab/test results, encounter notes, upcoming appointments, etc.  Non-urgent messages can be sent to your provider as well.   To learn more about what you can do with MyChart, go to [email or web address].

## 2023-12-20 MED ORDER — CLOPIDOGREL BISULFATE 75 MG PO TABS: 75.0000 mg | ORAL_TABLET | Freq: Every day | ORAL | 3 refills | Status: AC

## 2023-12-27 NOTE — Progress Notes (Unsigned)
  Electrophysiology Office Note:   Date:  12/28/2023  ID:  GRABIEL SCHMUTZ, DOB 1940-10-08, MRN [id number]  Primary Cardiologist: Emeline FORBES Calender, MD Electrophysiologist: Danelle Birmingham, MD   Electrophysiologist:  Danelle Birmingham, MD      History of Present Illness:   Brett Chambers is a 83 y.o. male with h/o PVCs, CAD, and HTN seen today for routine electrophysiology followup.   Since last being seen in our clinic the patient reports doing very well. No further episodes of dizziness. Mild lightheadedness with rapid standing on occasion, but not marked or limiting. Otherwise, he denies chest pain, palpitations, dyspnea, PND, orthopnea, nausea, vomiting, syncope, edema, weight gain, or early satiety.   Review of systems complete and found to be negative unless listed in HPI.   EP Information / Studies Reviewed:    EKG is ordered today. Personal review as below.  EKG Interpretation Date/Time:  Friday December 28 2023 09:00:52 EST Ventricular Rate:  56 PR Interval:  304 QRS Duration:  92 QT Interval:  422 QTC Calculation: 407 R Axis:   10  Text Interpretation: Sinus bradycardia with 1st degree A-V block with Premature atrial complexes in a pattern of bigeminy When compared with ECG of 06-Jun-2023 08:01, No significant change was found Confirmed by Lesia Sharper [phone]) on 12/28/2023 9:05:41 AM    Arrhythmia/Device History No specialty comments available.   Physical Exam:   VS:  BP (!) 140/80   Pulse (!) 56   Ht 6' (1.829 m)   Wt 204 lb (92.5 kg)   SpO2 98%   BMI 27.67 kg/m    Wt Readings from Last 3 Encounters:  12/28/23 204 lb (92.5 kg)  12/19/23 209 lb (94.8 kg)  06/06/23 208 lb (94.3 kg)     GEN: No acute distress NECK: No JVD; No carotid bruits CARDIAC: Regular rate and rhythm with occasional ectopy, no murmurs, rubs, gallops RESPIRATORY:  Clear to auscultation without rales, wheezing or rhonchi  ABDOMEN: Soft, non-tender, non-distended EXTREMITIES:  No edema; No deformity    ASSESSMENT AND PLAN:    Dizziness No further.  If recurs in clusters consider Zio.  If has infrequent near syncope, Loop recorder would be reasonable.    PVCs PACs EKG today shows sinus brady with occasional ectopy. Asymptomatic, and not felt to be cause of prior episode of dizziness.   HTN Stable on current regimen   CAD No s/s of ischemia.      Follow up with Me in 12 months.  Signed, Sharper Prentice Lesia, PA-C

## 2023-12-28 ENCOUNTER — Encounter: Payer: Self-pay | Admitting: Student

## 2023-12-28 ENCOUNTER — Ambulatory Visit: Attending: Student | Admitting: Student

## 2023-12-28 VITALS — BP 140/80 | HR 56 | Ht 72.0 in | Wt 204.0 lb

## 2023-12-28 DIAGNOSIS — I493 Ventricular premature depolarization: Secondary | ICD-10-CM | POA: Diagnosis not present

## 2023-12-28 DIAGNOSIS — I10 Essential (primary) hypertension: Secondary | ICD-10-CM | POA: Diagnosis not present

## 2023-12-28 DIAGNOSIS — I2510 Atherosclerotic heart disease of native coronary artery without angina pectoris: Secondary | ICD-10-CM | POA: Diagnosis not present

## 2023-12-28 DIAGNOSIS — R42 Dizziness and giddiness: Secondary | ICD-10-CM

## 2023-12-28 NOTE — Patient Instructions (Signed)
 Medication Instructions:  No medication changes today. *If you need a refill on your cardiac medications before your next appointment, please call your pharmacy*  Lab Work: No labwork ordered today. If you have labs (blood work) drawn today and your tests are completely normal, you will receive your results only by: MyChart Message (if you have MyChart) OR A paper copy in the mail If you have any lab test that is abnormal or we need to change your treatment, we will call you to review the results.  Testing/Procedures: No testing ordered today  Follow-Up: At [facility], you and your health needs are our priority.  As part of our continuing mission to provide you with exceptional heart care, our providers are all part of one team.  This team includes your primary Cardiologist (physician) and Advanced Practice Providers or APPs (Physician Assistants and Nurse Practitioners) who all work together to provide you with the care you need, when you need it.  Your next appointment:   12 month(s)  Provider:   You may see Manya Sells, MD or one of the following Advanced Practice Providers on your designated Care Team:   Mertha Abrahams, Kennard Pea "Jonelle Neri" [place], PA-C Suzann Riddle, NP Creighton Doffing, NP    We recommend signing up for the patient portal called "MyChart".  Sign up information is provided on this After Visit Summary.  MyChart is used to connect with patients for Virtual Visits (Telemedicine).  Patients are able to view lab/test results, encounter notes, upcoming appointments, etc.  Non-urgent messages can be sent to your provider as well.   To learn more about what you can do with MyChart, go to [email or web address].

## 2024-01-04 ENCOUNTER — Telehealth: Payer: Self-pay | Admitting: Internal Medicine

## 2024-01-04 ENCOUNTER — Other Ambulatory Visit: Payer: Self-pay

## 2024-01-04 MED ORDER — PINDOLOL 5 MG PO TABS: 2.5000 mg | ORAL_TABLET | Freq: Every day | ORAL | 3 refills | Status: AC

## 2024-01-04 NOTE — Telephone Encounter (Signed)
 Pt's medication was sent to pt's pharmacy as requested. Confirmation received.

## 2024-01-04 NOTE — Telephone Encounter (Signed)
*  STAT* If patient is at the pharmacy, call can be transferred to refill team.   1. Which medications need to be refilled? (please list name of each medication and dose if known) pindolol  (VISKEN ) 5 MG tablet   2. Which pharmacy/location (including street and city if local pharmacy) is medication to be sent to? [id number] - [place], [place] - [address]   3. Do they need a 30 day or 90 day supply? 90
